# Patient Record
Sex: Male | Born: 1985 | Race: White | Hispanic: No | Marital: Married | Smoking: Never smoker
Health system: Southern US, Community
[De-identification: ages and names within clinical notes are randomized; demographics above are authoritative.]

## PROBLEM LIST (undated history)

## (undated) DIAGNOSIS — K219 Gastro-esophageal reflux disease without esophagitis: Secondary | ICD-10-CM

## (undated) HISTORY — PX: TONSILLECTOMY: SUR1361

## (undated) HISTORY — PX: EXTERNAL EAR SURGERY: SHX627

---
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
## 2006-11-08 ENCOUNTER — Emergency Department (HOSPITAL_COMMUNITY): Admission: EM | Admit: 2006-11-08 | Discharge: 2006-11-08 | Payer: Self-pay | Admitting: Emergency Medicine

## 2010-09-23 ENCOUNTER — Emergency Department (HOSPITAL_COMMUNITY)
Admission: EM | Admit: 2010-09-23 | Discharge: 2010-09-23 | Disposition: A | Discharge status: Home / Self Care | Payer: No Typology Code available for payment source | Attending: Emergency Medicine | Admitting: Emergency Medicine

## 2010-09-23 ENCOUNTER — Emergency Department (HOSPITAL_COMMUNITY): Payer: Self-pay

## 2010-09-23 DIAGNOSIS — S139XXA Sprain of joints and ligaments of unspecified parts of neck, initial encounter: Secondary | ICD-10-CM | POA: Insufficient documentation

## 2010-12-29 LAB — GC/CHLAMYDIA PROBE AMP, GENITAL: Chlamydia, DNA Probe: NEGATIVE

## 2010-12-29 LAB — URINALYSIS, ROUTINE W REFLEX MICROSCOPIC
Bilirubin Urine: NEGATIVE
Hgb urine dipstick: NEGATIVE
Ketones, ur: NEGATIVE
Nitrite: NEGATIVE
Protein, ur: NEGATIVE
Urobilinogen, UA: 1

## 2012-11-07 ENCOUNTER — Emergency Department (HOSPITAL_COMMUNITY)
Admission: EM | Admit: 2012-11-07 | Discharge: 2012-11-07 | Disposition: A | Discharge status: Home / Self Care | Payer: Self-pay | Attending: Emergency Medicine | Admitting: Emergency Medicine

## 2012-11-07 ENCOUNTER — Encounter (HOSPITAL_COMMUNITY): Payer: Self-pay | Admitting: *Deleted

## 2012-11-07 DIAGNOSIS — Z880 Allergy status to penicillin: Secondary | ICD-10-CM | POA: Insufficient documentation

## 2012-11-07 DIAGNOSIS — R109 Unspecified abdominal pain: Secondary | ICD-10-CM | POA: Insufficient documentation

## 2012-11-07 DIAGNOSIS — Z8719 Personal history of other diseases of the digestive system: Secondary | ICD-10-CM | POA: Insufficient documentation

## 2012-11-07 HISTORY — DX: Gastro-esophageal reflux disease without esophagitis: K21.9

## 2012-11-07 MED ORDER — DICYCLOMINE HCL 20 MG PO TABS: 20.0000 mg | ORAL_TABLET | Freq: Two times a day (BID) | ORAL | Status: DC

## 2012-11-07 NOTE — ED Provider Notes (Signed)
  CSN: [id number]     Arrival date & time 11/07/12  1225 History     First MD Initiated Contact with Patient 11/07/12 1241     Chief Complaint  Patient presents with  . Diarrhea   (Consider location/radiation/quality/duration/timing/severity/associated sxs/prior Treatment) Patient is a 27 y.o. male presenting with diarrhea. The history is provided by the patient and a parent.  Diarrhea  patient here complaining of abdominal cramping and loose stools times several months. No fever or chills. No vomiting. No urinary symptoms. For the last 24 hours, patient has noted sharp right-sided abdominal pain that is better after he moves his bowels. Denies any current diarrhea. Denies any weakness with standing. Denies any dizziness. No blood in his stool. Also notes increased flatus. Patient is concerned that he might have IBS because there is a strong family history of this. He does have a history of GERD and takes a PPI for this.  Past Medical History  Diagnosis Date  . GERD (gastroesophageal reflux disease)    Past Surgical History  Procedure Laterality Date  . External ear surgery     History reviewed. No pertinent family history. History  Substance Use Topics  . Smoking status: Never Smoker   . Smokeless tobacco: Not on file  . Alcohol Use: No    Review of Systems  Gastrointestinal: Positive for diarrhea.  All other systems reviewed and are negative.    Allergies  Penicillins  Home Medications  No current outpatient prescriptions on file. BP 138/89  Pulse 97  Temp(Src) 97.8 F (36.6 C) (Oral)  Resp 18  SpO2 98% Physical Exam  Nursing note and vitals reviewed. Constitutional: He is oriented to person, place, and time. He appears well-developed and well-nourished.  Non-toxic appearance. No distress.  HENT:  Head: Normocephalic and atraumatic.  Eyes: Conjunctivae, EOM and lids are normal. Pupils are equal, round, and reactive to light.  Neck: Normal range of motion. Neck  supple. No tracheal deviation present. No mass present.  Cardiovascular: Normal rate, regular rhythm and normal heart sounds.  Exam reveals no gallop.   No murmur heard. Pulmonary/Chest: Effort normal and breath sounds normal. No stridor. No respiratory distress. He has no decreased breath sounds. He has no wheezes. He has no rhonchi. He has no rales.  Abdominal: Soft. Normal appearance and bowel sounds are normal. He exhibits no distension. There is no tenderness. There is no rigidity, no rebound, no guarding and no CVA tenderness.  Musculoskeletal: Normal range of motion. He exhibits no edema and no tenderness.  Neurological: He is alert and oriented to person, place, and time. He has normal strength. No cranial nerve deficit or sensory deficit. GCS eye subscore is 4. GCS verbal subscore is 5. GCS motor subscore is 6.  Skin: Skin is warm and dry. No abrasion and no rash noted.  Psychiatric: He has a normal mood and affect. His speech is normal and behavior is normal.    ED Course   Procedures (including critical care time)  Labs Reviewed - No data to display No results found. No diagnosis found.  MDM  Patient with nonsurgical abdomen at this time. Suspect that the patient might indeed have IBS versus other abdominal pathology. I will prescribe the patient and to and give referral to GI on call. They do not believe that there is an emergency condition at this time. Family is agreeable to this. return process couldn't  Toy Baker, MD 11/07/12 1259

## 2012-11-07 NOTE — ED Notes (Addendum)
Pt reports abdominal cramping and diarrhea x several months.  Reports that last night he had sharp pain in his (R) side-states that the pain subsided when he had a BM.  Pt has a GI doctor.

## 2012-11-11 ENCOUNTER — Emergency Department (HOSPITAL_COMMUNITY): Payer: Medicaid Other

## 2012-11-11 ENCOUNTER — Encounter (HOSPITAL_COMMUNITY): Payer: Self-pay | Admitting: Cardiology

## 2012-11-11 ENCOUNTER — Observation Stay (HOSPITAL_COMMUNITY)
Admission: EM | Admit: 2012-11-11 | Discharge: 2012-11-12 | Disposition: A | Discharge status: Home / Self Care | Payer: Medicaid Other | Attending: General Surgery | Admitting: General Surgery

## 2012-11-11 DIAGNOSIS — S0181XA Laceration without foreign body of other part of head, initial encounter: Secondary | ICD-10-CM

## 2012-11-11 DIAGNOSIS — K219 Gastro-esophageal reflux disease without esophagitis: Secondary | ICD-10-CM | POA: Insufficient documentation

## 2012-11-11 DIAGNOSIS — S060X9A Concussion with loss of consciousness of unspecified duration, initial encounter: Secondary | ICD-10-CM

## 2012-11-11 DIAGNOSIS — Y9269 Other specified industrial and construction area as the place of occurrence of the external cause: Secondary | ICD-10-CM | POA: Insufficient documentation

## 2012-11-11 DIAGNOSIS — S060XAA Concussion with loss of consciousness status unknown, initial encounter: Secondary | ICD-10-CM

## 2012-11-11 DIAGNOSIS — S0100XA Unspecified open wound of scalp, initial encounter: Secondary | ICD-10-CM

## 2012-11-11 DIAGNOSIS — S0180XA Unspecified open wound of other part of head, initial encounter: Secondary | ICD-10-CM | POA: Insufficient documentation

## 2012-11-11 DIAGNOSIS — S0101XA Laceration without foreign body of scalp, initial encounter: Secondary | ICD-10-CM

## 2012-11-11 DIAGNOSIS — S0990XA Unspecified injury of head, initial encounter: Secondary | ICD-10-CM

## 2012-11-11 DIAGNOSIS — S63509A Unspecified sprain of unspecified wrist, initial encounter: Secondary | ICD-10-CM | POA: Insufficient documentation

## 2012-11-11 DIAGNOSIS — S098XXA Other specified injuries of head, initial encounter: Secondary | ICD-10-CM

## 2012-11-11 DIAGNOSIS — W208XXA Other cause of strike by thrown, projected or falling object, initial encounter: Secondary | ICD-10-CM | POA: Insufficient documentation

## 2012-11-11 DIAGNOSIS — S060X1A Concussion with loss of consciousness of 30 minutes or less, initial encounter: Principal | ICD-10-CM | POA: Insufficient documentation

## 2012-11-11 DIAGNOSIS — Y990 Civilian activity done for income or pay: Secondary | ICD-10-CM | POA: Insufficient documentation

## 2012-11-11 LAB — BASIC METABOLIC PANEL
BUN: 8 mg/dL (ref 6–23)
CO2: 25 mEq/L (ref 19–32)
Calcium: 9.2 mg/dL (ref 8.4–10.5)
Chloride: 104 mEq/L (ref 96–112)
Creatinine, Ser: 1.08 mg/dL (ref 0.50–1.35)
GFR calc Af Amer: >90 mL/min (ref >90)
GFR calc non Af Amer: >90 mL/min (ref >90)
Glucose, Bld: 130 mg/dL — ABNORMAL HIGH (ref 70–99)
Potassium: 2.8 mEq/L — ABNORMAL LOW (ref 3.5–5.1)
Sodium: 140 mEq/L (ref 135–145)

## 2012-11-11 LAB — CBC WITH DIFFERENTIAL/PLATELET
Basophils Absolute: 0 10*3/uL (ref 0.0–0.1)
Basophils Relative: 0 % (ref 0–1)
Eosinophils Absolute: 0.1 10*3/uL (ref 0.0–0.7)
Eosinophils Relative: 1 % (ref 0–5)
HCT: 42.7 % (ref 39.0–52.0)
Hemoglobin: 15.2 g/dL (ref 13.0–17.0)
Lymphocytes Relative: 33 % (ref 12–46)
Lymphs Abs: 2.4 10*3/uL (ref 0.7–4.0)
MCH: 29.2 pg (ref 26.0–34.0)
MCHC: 35.6 g/dL (ref 30.0–36.0)
MCV: 82.1 fL (ref 78.0–100.0)
Monocytes Absolute: 0.4 10*3/uL (ref 0.1–1.0)
Monocytes Relative: 5 % (ref 3–12)
Neutro Abs: 4.3 10*3/uL (ref 1.7–7.7)
Neutrophils Relative %: 60 % (ref 43–77)
Platelets: 280 10*3/uL (ref 150–400)
RBC: 5.2 MIL/uL (ref 4.22–5.81)
RDW: 13.2 % (ref 11.5–15.5)
WBC: 7.2 10*3/uL (ref 4.0–10.5)

## 2012-11-11 LAB — TYPE AND SCREEN
ABO/RH(D): A POS
Antibody Screen: NEGATIVE

## 2012-11-11 LAB — ABO/RH: ABO/RH(D): A POS

## 2012-11-11 MED ORDER — BACITRACIN ZINC 500 UNIT/GM EX OINT
TOPICAL_OINTMENT | Freq: Two times a day (BID) | CUTANEOUS | Status: DC
  Administered 2012-11-11 – 2012-11-12 (×2): 31.5556 via TOPICAL
  Filled 2012-11-11 (×2): qty 28.35

## 2012-11-11 MED ORDER — HYDROCODONE-ACETAMINOPHEN 5-325 MG PO TABS: 2.0000 | ORAL_TABLET | ORAL | Status: DC | PRN

## 2012-11-11 MED ORDER — ONDANSETRON HCL 4 MG PO TABS: 4.0000 mg | ORAL_TABLET | Freq: Four times a day (QID) | ORAL | Status: DC | PRN

## 2012-11-11 MED ORDER — MORPHINE SULFATE 4 MG/ML IJ SOLN
4.0000 mg | Freq: Once | INTRAMUSCULAR | Status: AC
  Administered 2012-11-11: 4 mg via INTRAVENOUS
  Filled 2012-11-11: qty 1

## 2012-11-11 MED ORDER — HYDROCODONE-ACETAMINOPHEN 5-325 MG PO TABS: 1.0000 | ORAL_TABLET | ORAL | Status: DC | PRN

## 2012-11-11 MED ORDER — POTASSIUM CHLORIDE CRYS ER 20 MEQ PO TBCR
60.0000 meq | EXTENDED_RELEASE_TABLET | Freq: Once | ORAL | Status: AC
  Administered 2012-11-11: 60 meq via ORAL
  Filled 2012-11-11: qty 3

## 2012-11-11 MED ORDER — ONDANSETRON HCL 4 MG/2ML IJ SOLN
4.0000 mg | Freq: Once | INTRAMUSCULAR | Status: AC
  Administered 2012-11-11: 4 mg via INTRAVENOUS
  Filled 2012-11-11: qty 2

## 2012-11-11 MED ORDER — ENOXAPARIN SODIUM 40 MG/0.4ML ~~LOC~~ SOLN
40.0000 mg | SUBCUTANEOUS | Status: DC
  Filled 2012-11-11 (×3): qty 0.4

## 2012-11-11 MED ORDER — DICYCLOMINE HCL 20 MG PO TABS
20.0000 mg | ORAL_TABLET | Freq: Two times a day (BID) | ORAL | Status: DC
  Administered 2012-11-11 – 2012-11-12 (×2): 20 mg via ORAL
  Filled 2012-11-11 (×4): qty 1

## 2012-11-11 MED ORDER — SODIUM CHLORIDE 0.9 % IV BOLUS (SEPSIS)
1000.0000 mL | Freq: Once | INTRAVENOUS | Status: AC
  Administered 2012-11-11: 1000 mL via INTRAVENOUS

## 2012-11-11 MED ORDER — TETANUS-DIPHTH-ACELL PERTUSSIS 5-2.5-18.5 LF-MCG/0.5 IM SUSP
0.5000 mL | Freq: Once | INTRAMUSCULAR | Status: AC
  Administered 2012-11-11: 0.5 mL via INTRAMUSCULAR
  Filled 2012-11-11: qty 0.5

## 2012-11-11 MED ORDER — PANTOPRAZOLE SODIUM 40 MG PO TBEC
80.0000 mg | DELAYED_RELEASE_TABLET | Freq: Every day | ORAL | Status: DC
  Administered 2012-11-12: 80 mg via ORAL
  Filled 2012-11-11: qty 2

## 2012-11-11 MED ORDER — KETOROLAC TROMETHAMINE 15 MG/ML IJ SOLN
15.0000 mg | Freq: Once | INTRAMUSCULAR | Status: AC
  Administered 2012-11-11: 15 mg via INTRAVENOUS
  Filled 2012-11-11: qty 1

## 2012-11-11 MED ORDER — ONDANSETRON HCL 4 MG/2ML IJ SOLN: 4.0000 mg | Freq: Four times a day (QID) | INTRAMUSCULAR | Status: DC | PRN

## 2012-11-11 MED ORDER — SODIUM CHLORIDE 0.45 % IV SOLN
INTRAVENOUS | Status: DC
  Administered 2012-11-11: 22:00:00 via INTRAVENOUS
  Filled 2012-11-11 (×3): qty 1000

## 2012-11-11 MED ORDER — ACETAMINOPHEN 325 MG PO TABS
650.0000 mg | ORAL_TABLET | ORAL | Status: DC | PRN
  Administered 2012-11-11: 650 mg via ORAL
  Filled 2012-11-11: qty 2

## 2012-11-11 NOTE — ED Notes (Signed)
Pt given warm blankets.

## 2012-11-11 NOTE — ED Notes (Signed)
Patient transported to CT 

## 2012-11-11 NOTE — Progress Notes (Signed)
Responded to level 2 trauma to provide emotional and spiritual support to pt. Family. Pt. Was at work changing tire when tire exploded causing head injury. Mother and Father at bedside. Pt. Preparing to go CT for scan.  Provided emotional support, words of encouragement and presence to parents and pt.Facilitated information sharing between staff and family. Will as needed.  11/11/12 1100  Clinical Encounter Type  Visited With Patient and family together;Health care provider  Visit Type Spiritual support;ED;Trauma  Referral From Nurse  Spiritual Encounters  Spiritual Needs Emotional  Stress Factors  Patient Stress Factors None identified  Family Stress [address]  [place], [phone]

## 2012-11-11 NOTE — ED Provider Notes (Signed)
  Physical Exam  BP 99/51  Pulse 75  Temp(Src) 98 F (36.7 C) (Oral)  Resp 16  Ht 5\' 5"  (1.651 m)  Wt 180 lb (81.647 kg)  BMI 29.95 kg/m2  SpO2 98%  Physical Exam  ED Course  Procedures  Pt s/p blunt trauma. Concussed. Trauma to see and evaluate for possible obs admission.   Derwood Kaplan, MD 11/11/12 [phone]

## 2012-11-11 NOTE — ED Notes (Signed)
Trauma service at the bedside.  

## 2012-11-11 NOTE — H&P (Signed)
Significant postconcussive symptoms. Patient examined and I agree with the assessment and plan We'll also check lumbar spine films to rule out fracture.  I spoke with the patient and his family and discussed the plan of care. Violeta Gelinas, MD, MPH, FACS Pager: [phone]  11/11/2012 5:44 PM

## 2012-11-11 NOTE — ED Notes (Signed)
Family at bedside. 

## 2012-11-11 NOTE — ED Provider Notes (Signed)
CSN: [id number]     Arrival date & time 11/11/12  1110 History   First MD Initiated Contact with Patient 11/11/12 1116     No chief complaint on file.  (Consider location/radiation/quality/duration/timing/severity/associated sxs/prior Treatment) HPI  27 year old male presenting as a level II trauma.Incident happened just before arrival. Patient was working on a tire which was off the vehicle. The tire exploded and the tire rim was launched into the air. Apparently struck the ground with enough force to leave a mark in the concrete before flying up into the air. Patient was struck in his head. No loss of consciousness. Some repetitive questioning and  confusion. Patient is complaining of a headache, otherwise denies any pain anywhere. No visual complaints. Mild nausea, but no vomiting. No numbness, tingling or loss of strength. No significant past medical history aside from reflux. He takes OTC medication for this. No blood thinners. Allergy to penicillin. Cannot remember when he last ate.  Past Medical History  Diagnosis Date  . GERD (gastroesophageal reflux disease)    Past Surgical History  Procedure Laterality Date  . External ear surgery     No family history on file. History  Substance Use Topics  . Smoking status: Never Smoker   . Smokeless tobacco: Not on file  . Alcohol Use: No    Review of Systems  All systems reviewed and negative, other than as noted in HPI.   Allergies  Penicillins  Home Medications   Current Outpatient Rx  Name  Route  Sig  Dispense  Refill  . dicyclomine (BENTYL) 20 MG tablet   Oral   Take 1 tablet (20 mg total) by mouth 2 (two) times daily.   20 tablet   0    SpO2 100% Physical Exam  Nursing note and vitals reviewed. Constitutional: He appears well-developed and well-nourished. No distress.  HENT:  4 cm laceration to the high left parietal region. Macerated wound margins. Mild oozing. Underlying hematoma. No pulsatile bleeding. No  defect palpated. In c-collar.   Eyes: Conjunctivae and EOM are normal. Pupils are equal, round, and reactive to light. Right eye exhibits no discharge. Left eye exhibits no discharge.  Neck: Neck supple.  Cardiovascular: Normal rate, regular rhythm and normal heart sounds.  Exam reveals no gallop and no friction rub.   No murmur heard. Pulmonary/Chest: Effort normal and breath sounds normal. No respiratory distress.  Abdominal: Soft. He exhibits no distension. There is no tenderness.  Musculoskeletal: He exhibits no edema and no tenderness.  No midline spinal tenderness  Neurological: He is alert. No cranial nerve deficit. He exhibits normal muscle tone. Coordination normal.  GCS 14. Mild confusion and some repetitive questioning.  Skin: Skin is warm and dry.  Psychiatric: He has a normal mood and affect. His behavior is normal. Thought content normal.    ED Course  Procedures (including critical care time)  LACERATION REPAIR Performed by: Raeford Razor Authorized by: Raeford Razor Consent: Verbal consent obtained. Risks and benefits: risks, benefits and alternatives were discussed Consent given by: patient Patient identity confirmed: provided demographic data Prepped and Draped in normal sterile fashion Wound explored  Laceration Location: forehead Laceration Length: 3 cm  No Foreign Bodies seen or palpated  Anesthesia: local infiltration  Local anesthetic: lidocaine 1 % w epinephrine  Anesthetic total: 2 ml  Irrigation method: syringe Amount of cleaning: standard  Skin closure: single layer  Number of sutures: 1  Technique: running  Patient tolerance: Patient tolerated the procedure well with no immediate  complications.  LACERATION REPAIR Performed by: Raeford Razor Authorized by: Raeford Razor Consent: Verbal consent obtained. Risks and benefits: risks, benefits and alternatives were discussed Consent given by: patient Patient identity confirmed:  provided demographic data Prepped and Draped in normal sterile fashion Wound explored  Laceration Location: scalp  Laceration Length: 4 cm  No Foreign Bodies seen or palpated  Anesthesia: local infiltration  Local anesthetic: none  Anesthetic total: n/a  Amount of cleaning: standard  Skin closure: single layer  Number of sutures: 7  Technique: stapled  Patient tolerance: Patient tolerated the procedure well with no immediate complications.   Labs Review Labs Reviewed  BASIC METABOLIC PANEL - Abnormal; Notable for the following:    Potassium 2.8 (*)    Glucose, Bld 130 (*)    All other components within normal limits  CBC WITH DIFFERENTIAL  TYPE AND SCREEN  ABO/RH   Imaging Review Ct Head Wo Contrast  11/11/2012   *RADIOLOGY REPORT*  Clinical Data:  Head injury, laceration to high left parietal region  CT HEAD WITHOUT CONTRAST CT CERVICAL SPINE WITHOUT CONTRAST  Technique:  Multidetector CT imaging of the head and cervical spine was performed following the standard protocol without intravenous contrast.  Multiplanar CT image reconstructions of the cervical spine were also generated.  Comparison:  None.  CT HEAD  Findings: No evidence of parenchymal hemorrhage or extra-axial fluid collection. No mass lesion, mass effect, or midline shift.  No CT evidence of acute infarction.  Cerebral volume is age appropriate.  No ventriculomegaly.  The visualized paranasal sinuses are essentially clear. The mastoid air cells are unopacified.  Soft tissue injury/laceration overlying the left vertex. Additional soft tissue injury/laceration overlying the left frontal bone.  No evidence of calvarial fracture.  IMPRESSION: Soft tissue injury/laceration overlying the left vertex and left frontal bone.  No evidence of calvarial fracture.  No evidence of acute intracranial abnormality.  CT CERVICAL SPINE  Findings: Mild straightening of the cervical spine, likely positional.  No evidence of fracture  or dislocation.  Vertebral body heights and intervertebral disc spaces are maintained.  Dens appears intact.  No prevertebral soft tissue swelling.  Visualized thyroid is unremarkable.  Visualized lung apices are clear.  IMPRESSION:  Normal cervical spine CT.   Original Report Authenticated By: Charline Bills, M.D.   Ct Cervical Spine Wo Contrast  11/11/2012   *RADIOLOGY REPORT*  Clinical Data:  Head injury, laceration to high left parietal region  CT HEAD WITHOUT CONTRAST CT CERVICAL SPINE WITHOUT CONTRAST  Technique:  Multidetector CT imaging of the head and cervical spine was performed following the standard protocol without intravenous contrast.  Multiplanar CT image reconstructions of the cervical spine were also generated.  Comparison:  None.  CT HEAD  Findings: No evidence of parenchymal hemorrhage or extra-axial fluid collection. No mass lesion, mass effect, or midline shift.  No CT evidence of acute infarction.  Cerebral volume is age appropriate.  No ventriculomegaly.  The visualized paranasal sinuses are essentially clear. The mastoid air cells are unopacified.  Soft tissue injury/laceration overlying the left vertex. Additional soft tissue injury/laceration overlying the left frontal bone.  No evidence of calvarial fracture.  IMPRESSION: Soft tissue injury/laceration overlying the left vertex and left frontal bone.  No evidence of calvarial fracture.  No evidence of acute intracranial abnormality.  CT CERVICAL SPINE  Findings: Mild straightening of the cervical spine, likely positional.  No evidence of fracture or dislocation.  Vertebral body heights and intervertebral disc spaces are maintained.  Dens  appears intact.  No prevertebral soft tissue swelling.  Visualized thyroid is unremarkable.  Visualized lung apices are clear.  IMPRESSION:  Normal cervical spine CT.   Original Report Authenticated By: Charline Bills, M.D.   Dg Hand Complete Right  11/11/2012   *RADIOLOGY REPORT*  Clinical Data:   right hand pain post trauma  RIGHT HAND - COMPLETE 3+ VIEW  Comparison: None.  Findings: Three views of the right hand submitted.  No acute fracture or subluxation.  No radiopaque foreign body.  IMPRESSION: No acute fracture or subluxation.   Original Report Authenticated By: Natasha Mead, M.D.    MDM   1. Head injury, initial encounter   2. Scalp laceration, initial encounter   3. Forehead laceration, initial encounter    27 year old male with a head injury. Cnfusion, otherwise neurological examination is nonfocal. No blood thinners. Otherwise healthy. Hemodynamically stable. Plan CT of the head. We will image the cervical spine as well as cannot clinically clear with patient being confused. Update tetanus. Further wound care/closure pending CT results.  On repeat exam, pt additionally with laceration to forehead requiring repair. Some mild swelling and tenderness in area of 2nd metacarpal R hand. WIll XR.   Attempted to ambulate. Vomiting. Continues with repetitive questioning. Will discuss with trauma for possible admit for observation.   Raeford Razor, MD 11/11/12 [phone]

## 2012-11-11 NOTE — ED Notes (Signed)
MD at bedside with suture cart. 

## 2012-11-11 NOTE — H&P (Signed)
Manuel Glover is an 27 y.o. male.   Chief Complaint: Concussion HPI: Manuel Glover was bending down to pick up a tire when it exploded. The rim hit him in the face, hit the ceiling, then dropped down onto his head. He fell to the ground and there was a brief loss of consciousness. He came in as a level 2 trauma. His workup was negative but he was quite post-concussive and we were asked to admit.  Past Medical History  Diagnosis Date  . GERD (gastroesophageal reflux disease)     Past Surgical History  Procedure Laterality Date  . External ear surgery      History reviewed. No pertinent family history. Social History:  reports that he has never smoked. He does not have any smokeless tobacco history on file. He reports that he does not drink alcohol or use illicit drugs.  Allergies:  Allergies  Allergen Reactions  . Penicillins     hives   Results for orders placed during the hospital encounter of 11/11/12 (from the past 48 hour(s))  TYPE AND SCREEN     Status: None   Collection Time    11/11/12 11:20 AM      Result Value Range   ABO/RH(D) A POS     Antibody Screen NEG     Sample Expiration 11/14/2012    ABO/RH     Status: None   Collection Time    11/11/12 11:20 AM      Result Value Range   ABO/RH(D) A POS    BASIC METABOLIC PANEL     Status: Abnormal   Collection Time    11/11/12 11:23 AM      Result Value Range   Sodium 140  135 - 145 mEq/L   Potassium 2.8 (*) 3.5 - 5.1 mEq/L   Chloride 104  96 - 112 mEq/L   CO2 25  19 - 32 mEq/L   Glucose, Bld 130 (*) 70 - 99 mg/dL   BUN 8  6 - 23 mg/dL   Creatinine, Ser 1.61  0.50 - 1.35 mg/dL   Calcium 9.2  8.4 - 09.6 mg/dL   GFR calc non Af Amer >90  >90 mL/min   GFR calc Af Amer >90  >90 mL/min   Comment: (NOTE)     The eGFR has been calculated using the CKD EPI equation.     This calculation has not been validated in all clinical situations.     eGFR's persistently <90 mL/min signify possible Chronic Kidney     Disease.  CBC WITH  DIFFERENTIAL     Status: None   Collection Time    11/11/12 11:23 AM      Result Value Range   WBC 7.2  4.0 - 10.5 K/uL   RBC 5.20  4.22 - 5.81 MIL/uL   Hemoglobin 15.2  13.0 - 17.0 g/dL   HCT 04.5  40.9 - 81.1 %   MCV 82.1  78.0 - 100.0 fL   MCH 29.2  26.0 - 34.0 pg   MCHC 35.6  30.0 - 36.0 g/dL   RDW 91.4  78.2 - 95.6 %   Platelets 280  150 - 400 K/uL   Neutrophils Relative % 60  43 - 77 %   Neutro Abs 4.3  1.7 - 7.7 K/uL   Lymphocytes Relative 33  12 - 46 %   Lymphs Abs 2.4  0.7 - 4.0 K/uL   Monocytes Relative 5  3 - 12 %   Monocytes Absolute  0.4  0.1 - 1.0 K/uL   Eosinophils Relative 1  0 - 5 %   Eosinophils Absolute 0.1  0.0 - 0.7 K/uL   Basophils Relative 0  0 - 1 %   Basophils Absolute 0.0  0.0 - 0.1 K/uL   Ct Head Wo Contrast  11/11/2012   *RADIOLOGY REPORT*  Clinical Data:  Head injury, laceration to high left parietal region  CT HEAD WITHOUT CONTRAST CT CERVICAL SPINE WITHOUT CONTRAST  Technique:  Multidetector CT imaging of the head and cervical spine was performed following the standard protocol without intravenous contrast.  Multiplanar CT image reconstructions of the cervical spine were also generated.  Comparison:  None.  CT HEAD  Findings: No evidence of parenchymal hemorrhage or extra-axial fluid collection. No mass lesion, mass effect, or midline shift.  No CT evidence of acute infarction.  Cerebral volume is age appropriate.  No ventriculomegaly.  The visualized paranasal sinuses are essentially clear. The mastoid air cells are unopacified.  Soft tissue injury/laceration overlying the left vertex. Additional soft tissue injury/laceration overlying the left frontal bone.  No evidence of calvarial fracture.  IMPRESSION: Soft tissue injury/laceration overlying the left vertex and left frontal bone.  No evidence of calvarial fracture.  No evidence of acute intracranial abnormality.  CT CERVICAL SPINE  Findings: Mild straightening of the cervical spine, likely positional.  No  evidence of fracture or dislocation.  Vertebral body heights and intervertebral disc spaces are maintained.  Dens appears intact.  No prevertebral soft tissue swelling.  Visualized thyroid is unremarkable.  Visualized lung apices are clear.  IMPRESSION:  Normal cervical spine CT.   Original Report Authenticated By: Charline Bills, M.D.   Dg Hand Complete Right  11/11/2012   *RADIOLOGY REPORT*  Clinical Data:  right hand pain post trauma  RIGHT HAND - COMPLETE 3+ VIEW  Comparison: None.  Findings: Three views of the right hand submitted.  No acute fracture or subluxation.  No radiopaque foreign body.  IMPRESSION: No acute fracture or subluxation.   Original Report Authenticated By: Natasha Mead, M.D.    Review of Systems  Constitutional: Negative for weight loss.  HENT: Negative for hearing loss, ear pain, neck pain, tinnitus and ear discharge.   Eyes: Negative for blurred vision, double vision, photophobia and pain.  Respiratory: Negative for cough, sputum production and shortness of breath.   Cardiovascular: Negative for chest pain.  Gastrointestinal: Positive for nausea and vomiting. Negative for abdominal pain.  Genitourinary: Negative for dysuria, urgency, frequency and flank pain.  Musculoskeletal: Negative for myalgias, back pain, joint pain and falls.  Neurological: Positive for loss of consciousness. Negative for dizziness, tingling, sensory change, focal weakness and headaches.  Endo/Heme/Allergies: Does not bruise/bleed easily.  Psychiatric/Behavioral: Positive for memory loss. Negative for depression and substance abuse. The patient is not nervous/anxious.     Blood pressure 99/51, pulse 75, temperature 98 F (36.7 C), temperature source Oral, resp. rate 16, height 5\' 5"  (1.651 m), weight 180 lb (81.647 kg), SpO2 98.00%. Physical Exam  Vitals reviewed. Constitutional: He is oriented to person, place, and time. He appears well-developed and well-nourished. He is cooperative. No  distress. Cervical collar and nasal cannula in place.  HENT:  Head: Normocephalic. Head is with laceration. Head is without raccoon's eyes, without Battle's sign, without abrasion and without contusion.    Right Ear: Hearing and external ear normal. No lacerations. No drainage or tenderness. No foreign bodies.  Left Ear: Hearing and external ear normal. No lacerations. No drainage or  tenderness. No foreign bodies.  Ears:  Nose: Nose normal. No nose lacerations, sinus tenderness, nasal deformity or nasal septal hematoma. No epistaxis.  Mouth/Throat: Uvula is midline, oropharynx is clear and moist and mucous membranes are normal. No lacerations. No oropharyngeal exudate.  Eyes: Conjunctivae, EOM and lids are normal. Pupils are equal, round, and reactive to light. Right eye exhibits no discharge. Left eye exhibits no discharge. No scleral icterus.  Neck: Trachea normal and normal range of motion. Neck supple. No JVD present. No spinous process tenderness and no muscular tenderness present. Carotid bruit is not present. No tracheal deviation present. No thyromegaly present.  Cardiovascular: Normal rate, regular rhythm, normal heart sounds, intact distal pulses and normal pulses.  Exam reveals no gallop and no friction rub.   No murmur heard. Respiratory: Effort normal and breath sounds normal. No stridor. No respiratory distress. He has no wheezes. He has no rales. He exhibits no tenderness, no bony tenderness, no laceration and no crepitus.  GI: Soft. Normal appearance and bowel sounds are normal. He exhibits no distension. There is no tenderness. There is no rigidity, no rebound, no guarding and no CVA tenderness.  Musculoskeletal: Normal range of motion. He exhibits no edema and no tenderness.       Lumbar back: He exhibits bony tenderness (~L2).  Lymphadenopathy:    He has no cervical adenopathy.  Neurological: He is alert and oriented to person, place, and time. He has normal strength. No  cranial nerve deficit or sensory deficit. GCS eye subscore is 4. GCS verbal subscore is 5. GCS motor subscore is 6.  Skin: Skin is warm, dry and intact. He is not diaphoretic.  Psychiatric: He has a normal mood and affect. His speech is normal and behavior is normal. He exhibits abnormal recent memory.     Assessment/Plan Blunt head trauma Concussion -- Supportive care, ST consult [place] -- Local care L-spine TTP -- Will get x-rays GERD -- Protonix    Freeman Caldron, PA-C Pager: [phone] General Trauma PA Pager: [phone]  11/11/2012, 3:55 PM

## 2012-11-11 NOTE — ED Notes (Signed)
Pt to department via EMS- pt reports that he was at work. States that he was filling a tire and it exploded. The tire rim hit him in the top of the head. Pt with repetitive questioning, but alert to self and surroundings. No distress noted. Bleeding controlled. Bp-140/100 Hr-100 20g left hand

## 2012-11-12 MED ORDER — BACITRACIN ZINC 500 UNIT/GM EX OINT: TOPICAL_OINTMENT | Freq: Two times a day (BID) | CUTANEOUS | Status: DC

## 2012-11-12 MED ORDER — ACETAMINOPHEN 325 MG PO TABS: 650.0000 mg | ORAL_TABLET | ORAL | Status: DC | PRN
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
## 2012-11-12 NOTE — Progress Notes (Signed)
Improved. Will see how he does with therapies. Possible D/C today. Patient examined and I agree with the assessment and plan  Violeta Gelinas, MD, MPH, FACS Pager: [phone]  11/12/2012 9:17 AM

## 2012-11-12 NOTE — Progress Notes (Signed)
Patient ID: Manuel Glover, male   DOB: 1986-03-12, 27 y.o.   MRN: [id number]  LOS: 1 day   Subjective: Pt feeling much better today, no headache.  No nausea or vomiting, appetite is poor.  He has been ambulating.  Voiding without problems.  Denies weakness.  Complaining of right wrist pain.  Objective: Vital signs in last 24 hours: Temp:  [98 F (36.7 C)-99.8 F (37.7 C)] 98.4 F (36.9 C) (08/27 0555) Pulse Rate:  [56-95] 56 (08/27 0555) Resp:  [11-18] 16 (08/27 0555) BP: (99-139)/(51-94) 101/55 mmHg (08/27 0555) SpO2:  [98 %-100 %] 100 % (08/27 0555) Weight:  [180 lb (81.647 kg)-185 lb 3 oz (84 kg)] 185 lb 3 oz (84 kg) (08/26 2147) Last BM Date: 11/10/12  Lab Results:  CBC  Recent Labs  11/11/12 1123  WBC 7.2  HGB 15.2  HCT 42.7  PLT 280   BMET  Recent Labs  11/11/12 1123  NA 140  K 2.8*  CL 104  CO2 25  GLUCOSE 130*  BUN 8  CREATININE 1.08  CALCIUM 9.2    Imaging: Dg Lumbar Spine Complete  11/11/2012   *RADIOLOGY REPORT*  Clinical Data: Pain after fall  LUMBAR SPINE - COMPLETE 4+ VIEW  Comparison: CT urogram 03/29/2008  Findings: There are five lumbar-type vertebral bodies.  Vertebral bodies are normal in height and alignment.  The disc spaces are preserved.  No pars defect or acute fracture is identified. Sacroiliac joints appear within normal limits.  Visualized bowel gas pattern and visualized portion the bony pelvis appear within normal limits.  IMPRESSION: No acute bony abnormality or significant degenerative change.   Original Report Authenticated By: Britta Mccreedy, M.D.   Ct Head Wo Contrast  11/11/2012   *RADIOLOGY REPORT*  Clinical Data:  Head injury, laceration to high left parietal region  CT HEAD WITHOUT CONTRAST CT CERVICAL SPINE WITHOUT CONTRAST  Technique:  Multidetector CT imaging of the head and cervical spine was performed following the standard protocol without intravenous contrast.  Multiplanar CT image reconstructions of the cervical spine were  also generated.  Comparison:  None.  CT HEAD  Findings: No evidence of parenchymal hemorrhage or extra-axial fluid collection. No mass lesion, mass effect, or midline shift.  No CT evidence of acute infarction.  Cerebral volume is age appropriate.  No ventriculomegaly.  The visualized paranasal sinuses are essentially clear. The mastoid air cells are unopacified.  Soft tissue injury/laceration overlying the left vertex. Additional soft tissue injury/laceration overlying the left frontal bone.  No evidence of calvarial fracture.  IMPRESSION: Soft tissue injury/laceration overlying the left vertex and left frontal bone.  No evidence of calvarial fracture.  No evidence of acute intracranial abnormality.  CT CERVICAL SPINE  Findings: Mild straightening of the cervical spine, likely positional.  No evidence of fracture or dislocation.  Vertebral body heights and intervertebral disc spaces are maintained.  Dens appears intact.  No prevertebral soft tissue swelling.  Visualized thyroid is unremarkable.  Visualized lung apices are clear.  IMPRESSION:  Normal cervical spine CT.   Original Report Authenticated By: Charline Bills, M.D.   Ct Cervical Spine Wo Contrast  11/11/2012   *RADIOLOGY REPORT*  Clinical Data:  Head injury, laceration to high left parietal region  CT HEAD WITHOUT CONTRAST CT CERVICAL SPINE WITHOUT CONTRAST  Technique:  Multidetector CT imaging of the head and cervical spine was performed following the standard protocol without intravenous contrast.  Multiplanar CT image reconstructions of the cervical spine were also generated.  Comparison:  None.  CT HEAD  Findings: No evidence of parenchymal hemorrhage or extra-axial fluid collection. No mass lesion, mass effect, or midline shift.  No CT evidence of acute infarction.  Cerebral volume is age appropriate.  No ventriculomegaly.  The visualized paranasal sinuses are essentially clear. The mastoid air cells are unopacified.  Soft tissue  injury/laceration overlying the left vertex. Additional soft tissue injury/laceration overlying the left frontal bone.  No evidence of calvarial fracture.  IMPRESSION: Soft tissue injury/laceration overlying the left vertex and left frontal bone.  No evidence of calvarial fracture.  No evidence of acute intracranial abnormality.  CT CERVICAL SPINE  Findings: Mild straightening of the cervical spine, likely positional.  No evidence of fracture or dislocation.  Vertebral body heights and intervertebral disc spaces are maintained.  Dens appears intact.  No prevertebral soft tissue swelling.  Visualized thyroid is unremarkable.  Visualized lung apices are clear.  IMPRESSION:  Normal cervical spine CT.   Original Report Authenticated By: Charline Bills, M.D.   Dg Hand Complete Right  11/11/2012   *RADIOLOGY REPORT*  Clinical Data:  right hand pain post trauma  RIGHT HAND - COMPLETE 3+ VIEW  Comparison: None.  Findings: Three views of the right hand submitted.  No acute fracture or subluxation.  No radiopaque foreign body.  IMPRESSION: No acute fracture or subluxation.   Original Report Authenticated By: Natasha Mead, M.D.     PE: General appearance: alert, cooperative, appears stated age and no distress Eyes: conjunctivae/corneas clear. PERRL, EOM's intact. Fundi benign. Head- lacerations closed, forehead with sutures and scalp with staples, no bleeding Resp: clear to auscultation bilaterally Cardio: regular rate and rhythm, S1, S2 normal, no murmur, click, rub or gallop GI: soft, non-tender; bowel sounds normal; no masses,  no organomegaly Extremities: right wrist-normal ROM, no ecchymosis, swelling noted, no evident fracture.  push/plls are strong and equal.  Distal pulses are intact.  No edema.   Patient Active Problem List   Diagnosis Date Noted  . Blunt head trauma 11/11/2012  . Concussion 11/11/2012  . Scalp laceration 11/11/2012  . Facial laceration 11/11/2012  . GERD (gastroesophageal reflux  disease) 11/11/2012   Assessment/Plan  Blunt head trauma  Concussion -- improving, ST and PT [place] -- Local care.  Schedule follow up in clinic friday VTE - SCD's, Lovenox, mobilize FEN - tolerating diet Dispo -- home today following ST/PT evaluation   Ashok Norris, ANP-BC Pager: [phone] General Trauma [facility] Pager: [phone]   11/12/2012 9:12 AM
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
## 2012-11-12 NOTE — Progress Notes (Signed)
Orthopedic Tech Progress Note Patient Details:  ERLIN GARDELLA [birth year] [id number]  Ortho Devices Type of Ortho Device: Ace wrap Ortho Device/Splint Interventions: Application   Cammer, Mickie Bail 11/12/2012, 11:19 AM
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
## 2012-11-13 ENCOUNTER — Telehealth (HOSPITAL_COMMUNITY): Payer: Self-pay | Admitting: Emergency Medicine

## 2012-11-13 NOTE — Telephone Encounter (Signed)
Went over wound care instructions.

## 2012-11-14 ENCOUNTER — Encounter (INDEPENDENT_AMBULATORY_CARE_PROVIDER_SITE_OTHER): Payer: Self-pay

## 2012-11-14 ENCOUNTER — Ambulatory Visit (INDEPENDENT_AMBULATORY_CARE_PROVIDER_SITE_OTHER): Payer: Medicaid Other | Admitting: General Surgery

## 2012-11-14 VITALS — BP 122/80 | HR 76 | Resp 18 | Ht 65.0 in | Wt 187.0 lb

## 2012-11-14 DIAGNOSIS — IMO0002 Reserved for concepts with insufficient information to code with codable children: Secondary | ICD-10-CM

## 2012-11-14 DIAGNOSIS — S0101XS Laceration without foreign body of scalp, sequela: Secondary | ICD-10-CM

## 2012-11-14 DIAGNOSIS — F0781 Postconcussional syndrome: Secondary | ICD-10-CM
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
## 2012-11-14 NOTE — Patient Instructions (Signed)
You may not drive until your symptoms resolve  We will take the rest of your staples out next Friday.

## 2012-11-21 ENCOUNTER — Ambulatory Visit (INDEPENDENT_AMBULATORY_CARE_PROVIDER_SITE_OTHER): Payer: Self-pay | Admitting: Internal Medicine

## 2012-11-21 ENCOUNTER — Encounter (INDEPENDENT_AMBULATORY_CARE_PROVIDER_SITE_OTHER): Payer: Self-pay

## 2012-11-21 VITALS — BP 110/72 | HR 72 | Temp 97.6°F | Resp 14 | Ht 65.0 in | Wt 187.2 lb

## 2012-11-21 DIAGNOSIS — S060X9A Concussion with loss of consciousness of unspecified duration, initial encounter: Secondary | ICD-10-CM

## 2012-11-21 DIAGNOSIS — S0100XA Unspecified open wound of scalp, initial encounter: Secondary | ICD-10-CM

## 2012-11-21 DIAGNOSIS — S0101XD Laceration without foreign body of scalp, subsequent encounter: Secondary | ICD-10-CM

## 2012-11-21 DIAGNOSIS — S060XAA Concussion with loss of consciousness status unknown, initial encounter: Secondary | ICD-10-CM

## 2012-11-21 DIAGNOSIS — S060X0D Concussion without loss of consciousness, subsequent encounter: Secondary | ICD-10-CM
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
## 2012-11-21 NOTE — Patient Instructions (Signed)
May resume regular activity without restrictions. Follow up as needed. Call with questions or concerns.  

## 2014-07-20 ENCOUNTER — Encounter (HOSPITAL_COMMUNITY): Payer: Self-pay | Admitting: *Deleted

## 2014-07-20 ENCOUNTER — Emergency Department (HOSPITAL_COMMUNITY)
Admission: EM | Admit: 2014-07-20 | Discharge: 2014-07-20 | Disposition: A | Discharge status: Home / Self Care | Payer: Medicaid Other | Attending: Emergency Medicine | Admitting: Emergency Medicine

## 2014-07-20 DIAGNOSIS — K029 Dental caries, unspecified: Secondary | ICD-10-CM | POA: Insufficient documentation

## 2014-07-20 DIAGNOSIS — K219 Gastro-esophageal reflux disease without esophagitis: Secondary | ICD-10-CM | POA: Insufficient documentation

## 2014-07-20 DIAGNOSIS — K088 Other specified disorders of teeth and supporting structures: Secondary | ICD-10-CM | POA: Insufficient documentation

## 2014-07-20 DIAGNOSIS — Z792 Long term (current) use of antibiotics: Secondary | ICD-10-CM | POA: Insufficient documentation

## 2014-07-20 DIAGNOSIS — K0889 Other specified disorders of teeth and supporting structures: Secondary | ICD-10-CM

## 2014-07-20 DIAGNOSIS — Z880 Allergy status to penicillin: Secondary | ICD-10-CM | POA: Insufficient documentation

## 2014-07-20 DIAGNOSIS — Z79899 Other long term (current) drug therapy: Secondary | ICD-10-CM | POA: Insufficient documentation

## 2014-07-20 MED ORDER — NAPROXEN 500 MG PO TABS
500.0000 mg | ORAL_TABLET | Freq: Once | ORAL | Status: AC
  Administered 2014-07-20: 500 mg via ORAL
  Filled 2014-07-20: qty 1

## 2014-07-20 MED ORDER — CLINDAMYCIN HCL 150 MG PO CAPS: 300.0000 mg | ORAL_CAPSULE | Freq: Three times a day (TID) | ORAL | Status: DC

## 2014-07-20 MED ORDER — NAPROXEN 500 MG PO TABS: 500.0000 mg | ORAL_TABLET | Freq: Two times a day (BID) | ORAL | Status: DC

## 2014-07-20 MED ORDER — TRAMADOL HCL 50 MG PO TABS: 50.0000 mg | ORAL_TABLET | Freq: Four times a day (QID) | ORAL | Status: DC | PRN

## 2014-07-20 MED ORDER — CLINDAMYCIN HCL 300 MG PO CAPS
300.0000 mg | ORAL_CAPSULE | Freq: Once | ORAL | Status: AC
  Administered 2014-07-20: 300 mg via ORAL
  Filled 2014-07-20: qty 1

## 2014-07-20 NOTE — ED Notes (Signed)
Pt states having R sided dental pain, states its top and bottom, states started a week ago.

## 2014-07-20 NOTE — Discharge Instructions (Signed)
Please follow the directions provided. Use the referrals given or the resource guide below to establish care with a dentist for further treatment of this tooth pain. Please take your antibiotic as directed until it's all gone. Please take the naproxen twice a day to help with inflammation. You may use the Ultram to help with pain not relieved by the naproxen. Don't hesitate to return for any new, worsening, or concerning symptoms.   SEEK IMMEDIATE MEDICAL CARE IF:  You have a fever.  You develop redness and swelling of your face, jaw, or neck.  You are unable to open your mouth.  You have severe pain uncontrolled by pain medicine.    Emergency Department Resource Guide 1) Find a Doctor and Pay Out of Pocket Although you won't have to find out who is covered by your insurance plan, it is a good idea to ask around and get recommendations. You will then need to call the office and see if the doctor you have chosen will accept you as a new patient and what types of options they offer for patients who are self-pay. Some doctors offer discounts or will set up payment plans for their patients who do not have insurance, but you will need to ask so you aren't surprised when you get to your appointment.  2) Contact Your Local Health Department Not all health departments have doctors that can see patients for sick visits, but many do, so it is worth a call to see if yours does. If you don't know where your local health department is, you can check in your phone book. The CDC also has a tool to help you locate your state's health department, and many state websites also have listings of all of their local health departments.  3) Find a Walk-in Clinic If your illness is not likely to be very severe or complicated, you may want to try a walk in clinic. These are popping up all over the country in pharmacies, drugstores, and shopping centers. They're usually staffed by nurse practitioners or physician assistants  that have been trained to treat common illnesses and complaints. They're usually fairly quick and inexpensive. However, if you have serious medical issues or chronic medical problems, these are probably not your best option.  No Primary Care Doctor: - Call Health Connect at  [phone] - they can help you locate a primary care doctor that  accepts your insurance, provides certain services, etc. - Physician Referral Service- [phone]  Chronic Pain Problems: Organization         Address  Phone   Notes  [facility]  [phone] Patients need to be referred by their primary care doctor.   Medication Assistance: Organization         Address  Phone   Notes  [facility] Med[facility] [address] [place] [place], [place] [address] [phone] --Must be a resident of [facility] -- Must have NO insurance coverage whatsoever (no Medicaid/ Medicare, etc.) -- The pt. MUST have a primary care doctor that directs their care regularly and follows them in the community   MedAssist  [phone]   [employer]  [phone]    Agencies that provide inexpensive medical care: Organization         Address  Phone   Notes  [facility]  [phone]   [facility]    [phone]   [facility] [address] [place], [place] [address] [phone]   [facility] [address] [place] [place] [phone]   Planned Parenthood    [phone]   [facility]    [phone]   [facility] and [facility]  [address], [place] Phone:  [phone], Fax:  [phone] Hours of Operation:  9 am - 6 pm, M-F.  Also accepts Medicaid/Medicare and self-pay.  [facility] for [facility]  [address], [place] Phone: [phone], Fax: [phone]. Hours of Operation:  8:30 am - 5:30 pm, M-F.  Also accepts Medicaid  and self-pay.  [facility] [place] [address] [place] Phone: [phone]   [facility] [address] Natasha Bence [place], [place] [phone], Ext. [phone] Mondays & Thursdays: 7-9 AM.  First 15 patients are seen on a first come, first serve basis.    Medicaid-accepting [facility] Providers:  Organization         Address  Phone   Notes  [facility] [address], [place] [phone] Also accepts self-pay patients.  [facility] [address] Laurell Josephs [place], [place]  [phone]   [facility] [address], [place] [phone]   [facility] [address] [place] [phone]   Renaye Rakers [address], [place]   [phone] Only accepts [place] patients after they have their name applied to their card.   Self-Pay (no insurance) in [facility]:  Organization         Address  Phone   Notes  Sickle Cell Patients, [facility] [address] [place], [place] [phone]   [facility] [address] [place], [place] [phone]   [facility] [place]  [address], [place] [phone]   [facility]/Dr. Osei-Bonsu  [address] [place] or [address], [place] [phone] Phone number for both [place] and [place] locations is the same.  Urgent Medical and [facility] [address] [place] [phone]   [facility] [address], [place] or [address] [phone]   [facility] [address], [place] [phone], phone; [phone], fax Sees patients 1st and 3rd Saturday of every month.  Must not qualify for public or private insurance (i.e. Medicaid, Medicare, [place] Health Choice, Veterans' Benefits)  Household income  should be no more than 200% of the poverty level The clinic cannot treat you if you are pregnant or think you are pregnant  Sexually transmitted diseases are not treated at the clinic.    Dental Care: Organization         Address  Phone  Notes  [facility] [address] [place], [place] [phone] Accepts children up to age 85 who are enrolled in [place] or [place] Health Choice; pregnant women with a Medicaid card; and children who have applied for Medicaid or [place] Health Choice, but were declined, whose parents can pay a reduced fee at time of service.  [facility] of [facility]  [address], [place] [phone] Accepts children up to age 41 who are enrolled in [place] or [place]  Health Choice; pregnant women with a Medicaid card; and children who have applied for Medicaid or [place] Health Choice, but were declined, whose parents can pay a reduced fee at time of service.  [facility]  [address], [place] [phone] Patients are seen by appointment only. Walk-ins are not accepted. [facility] will see patients 28 years of age and older. Monday - Tuesday (8am-5pm) Most Wednesdays (8:30-5pm) $30 per visit, cash only  [facility]  [address], [facility] [place] [phone] Patients are seen by appointment only. Walk-ins are not accepted. [facility] will see patients 28 years of age and older. One Wednesday Evening (Monthly: Volunteer Based).  $30 per visit, cash only  [employer] [facility]  [phone] for adults; Children under age 974, call Graduate Pediatric Dentistry at [phone]) [phone]. Children aged 544-14, please call [phone] to request a pediatric application.  Dental services are provided in all areas of dental care including fillings, crowns and bridges, complete and partial dentures, implants, gum treatment,  root canals, and extractions. Preventive care is also provided. Treatment is provided to both adults and children. Patients are selected via a lottery and there is often a waiting list.   [facility] [address], [place]  [phone] [email or web address]   [facility] [address], [place], [place] [phone], Ext. [phone] Second and Fourth Thursday of each month, opens at 6:30 AM; Clinic ends at 9 AM.  Patients are seen on a first-come first-served basis, and a limited number are seen during each clinic.   [facility]  [address], [place], [place] [phone]   Eligibility Requirements You must have lived in [place], [place], or [place] counties for at least the last three months.   You cannot be eligible for state or federal sponsored National Cityhealthcare insurance, including CIGNAVeterans Administration, [place]icaid, or [employer]Medicare.   You generally cannot be eligible for healthcare insurance through your employer.    How to apply: Eligibility screenings are held every Tuesday and Wednesday afternoon from 1:00 pm until 4:00 pm. You do not need an appointment for the interview!  [facility] [address], [place], [place] [phone]   [facility]  [phone]   [facility]  [phone]   [facility]  [phone]    Behavioral Health Resources in the Community: Intensive Outpatient Programs Organization         Address  Phone  Notes  [facility] [address], [place], [place] [phone]   [facility] Outpatient [address], [place], [place] [phone]   [facility]: [facility] S[facility] [address], [place], [place]  [phone]   [facility] [address],  [place], [place] [phone] or [phone]   Substance Abuse Resources Organization         Address  Phone  Notes  [facility] Drug Services   [phone]   [facility]  [phone]   [facility]  [phone]   [facility]  [phone]   Residential & Outpatient Substance Abuse Program  [phone]   Psychological Services Organization         Address  Phone  Notes  [facility]  [phone]- [phone]   [facility]  [phone]- [phone]   [facility] [address], [place] [phone] or [phone]  Mobile Crisis Teams Organization         Address  Phone  Notes  [facility], Mobile Crisis Care Unit  [phone]   [facility]  [address] [place], [place] [phone]   Doristine LocksSharon DeEsch [address] [place] [phone]    Self-Help/Support Groups Organization         Address  Phone             Notes  [facility] [place] - variety of support groups  [phone] Call for more information  Narcotics Anonymous (NA), Caring [facility] [address], [place]  2 meetings at this location   Statisticianesidential Treatment Programs Organization         Address  Phone  Notes  [facility] [address],    [place]  [phone]   [facility]  [address], [place] [phone], [place], [place] [phone]   [facility] [address], [place] [phone] Admissions: 8am-3pm M-F  [facility] [address]    [place], [place] [phone]   [facility] [address], [place], [place] [phone]   [facility] [address]  [place], [place] [phone]   [facility] - Intensive Outpatient [address], [place], [place] [phone]   [facility] ([facility] [address]  [place], [place] [phone] or [phone]   [facility] (RT[facility]) [address] [place], [place] [phone] Accepts Medicaid  [facility] [address]  [place] [phone] Substance Abuse/Addiction Treatment   [facility] Resources Organization         Address  Phone  Notes  [facility]  [phone]) [phone]   Angie FavaJulie Brannon, PhD [address], [address] [place], [place]   [phone] or [phone]   [facility]   [address] [place], [place] [phone]) [phone]   [facility] [address], [place], [place] [phone]) [phone] Insurance/Medicaid/sponsorship through [facility]  Faith and Families [address]                                    [place], [place] [phone]) [phone] Therapy/tele-psych/case  [facility] [address]   [place], [place] [phone]    Dr. Lolly MustacheArfeen  [phone]   [facility] [place]  [employer] [facility] 1) [address],  2) [address], [place] 3)  [address], [place] [phone](336) [phone]  [phone]   [facility] Child Abuse Hotline [phone]) [phone] or [phone]) [phone] (After Hours)

## 2014-07-20 NOTE — ED Provider Notes (Signed)
CSN: [id number]     Arrival date & time 07/20/14  2137 History   First MD Initiated Contact with Patient 07/20/14 2144     Chief Complaint  Patient presents with  . Dental Pain   (Consider location/radiation/quality/duration/timing/severity/associated sxs/prior Treatment) HPI  Manuel Glover is a 29 year old male complaining of dental pain. He states this has been ongoing problem for years but worse in the last week. He states his right upper tooth causing intermittent pain that radiates through his jaw and sometimes behind his ear and up to his forehead. He notices pain on and off for the last few days. He's taken ibuprofen with some relief. But he felt like his cheek was more swollen and is concerned he is getting an infection. He currently rates his pain as a 3 out of 10. He denies any difficulty chewing or swallowing, fevers, chills, nausea or vomiting.  Past Medical History  Diagnosis Date  . GERD (gastroesophageal reflux disease)    Past Surgical History  Procedure Laterality Date  . External ear surgery     Family History  Problem Relation Age of Onset  . Cancer Maternal Uncle     accute lymphoma  . Cancer Maternal Grandmother     lymphoma   History  Substance Use Topics  . Smoking status: Never Smoker   . Smokeless tobacco: Never Used  . Alcohol Use: No    Review of Systems  Constitutional: Negative for fever.  HENT: Positive for dental problem.   Skin: Negative for rash.      Allergies  Penicillins  Home Medications   Prior to Admission medications   Medication Sig Start Date End Date Taking? Authorizing Provider  acetaminophen (TYLENOL) 325 MG tablet Take 2 tablets (650 mg total) by mouth every 4 (four) hours as needed. 11/12/12   Emina Riebock, NP  bacitracin ointment Apply topically 2 (two) times daily. Apply to scalp twice dailiy 11/12/12   Ashok Norris, NP  dicyclomine (BENTYL) 20 MG tablet Take 1 tablet (20 mg total) by mouth 2 (two) times daily.  11/07/12   Lorre Nick, MD  esomeprazole (NEXIUM) 40 MG capsule Take 40 mg by mouth daily before breakfast.    Historical Provider, MD   BP 146/97 mmHg  Pulse 82  Temp(Src) 98.7 F (37.1 C) (Oral)  Resp 18  Ht  (1.651 m)  Wt 195 lb (88.451 kg)  BMI 32.45 kg/m2  SpO2 95% Physical Exam  Constitutional: He appears well-developed and well-nourished. No distress.  HENT:  Head: Normocephalic and atraumatic.  Mouth/Throat: No trismus in the jaw. Dental caries present. No dental abscesses or uvula swelling.    Pain and dental caries to left upper 2nd molar.  No obvious abscess noted  Eyes: Conjunctivae are normal. Right eye exhibits no discharge. Left eye exhibits no discharge. No scleral icterus.  Cardiovascular: Intact distal pulses.   Pulmonary/Chest: Effort normal.  Neurological: He is alert. Coordination normal.  Skin: He is not diaphoretic.  Nursing note and vitals reviewed.   ED Course  Procedures (including critical care time) Labs Review Labs Reviewed - No data to display  Imaging Review No results found.   EKG Interpretation None      MDM   Final diagnoses:  Pain, dental   30 yo with recurrent toothache. Pain has subsided at time of exam. No gross abscess noted or concern for Ludwig's angina or spread of infection. Prescription for clindamycin, NSAIDS and pain medicine prescribed. Dental resources provided and urged patient  to follow-up with dentist.  Pt is well-appearing, in no acute distress and vital signs reviewed and not concerning. He appears safe to be discharged.  Return precautions provided. Pt aware of plan and in agreement.    Filed Vitals:   07/20/14 2146  BP: 146/97  Pulse: 82  Temp: 98.7 F (37.1 C)  TempSrc: Oral  Resp: 18  Height: 5\' 5"  (1.651 m)  Weight: 195 lb (88.451 kg)  SpO2: 95%   Meds given in ED:  Medications  clindamycin (CLEOCIN) capsule 300 mg (300 mg Oral Given 07/20/14 2236)  naproxen (NAPROSYN) tablet 500 mg (500 mg  Oral Given 07/20/14 2236)    Discharge Medication List as of 07/20/2014 10:34 PM    START taking these medications   Details  clindamycin (CLEOCIN) 150 MG capsule Take 2 capsules (300 mg total) by mouth 3 (three) times daily. May dispense as 150mg  capsules, Starting 07/20/2014, Until Discontinued, Print    naproxen (NAPROSYN) 500 MG tablet Take 1 tablet (500 mg total) by mouth 2 (two) times daily., Starting 07/20/2014, Until Discontinued, Print    traMADol (ULTRAM) 50 MG tablet Take 1 tablet (50 mg total) by mouth every 6 (six) hours as needed., Starting 07/20/2014, Until Discontinued, Print           Harle BattiestElizabeth Dulse Rutan, NP 07/21/14 [id number]  Linwood DibblesJon Knapp, MD 07/26/14 [phone]

## 2014-10-03 ENCOUNTER — Emergency Department (HOSPITAL_COMMUNITY): Payer: Medicaid Other

## 2014-10-03 ENCOUNTER — Encounter (HOSPITAL_COMMUNITY): Payer: Self-pay | Admitting: *Deleted

## 2014-10-03 ENCOUNTER — Emergency Department (HOSPITAL_COMMUNITY)
Admission: EM | Admit: 2014-10-03 | Discharge: 2014-10-03 | Disposition: A | Discharge status: Home / Self Care | Payer: Medicaid Other | Attending: Emergency Medicine | Admitting: Emergency Medicine

## 2014-10-03 DIAGNOSIS — K219 Gastro-esophageal reflux disease without esophagitis: Secondary | ICD-10-CM | POA: Insufficient documentation

## 2014-10-03 DIAGNOSIS — K6389 Other specified diseases of intestine: Secondary | ICD-10-CM | POA: Insufficient documentation

## 2014-10-03 DIAGNOSIS — K529 Noninfective gastroenteritis and colitis, unspecified: Secondary | ICD-10-CM

## 2014-10-03 DIAGNOSIS — Z79899 Other long term (current) drug therapy: Secondary | ICD-10-CM | POA: Insufficient documentation

## 2014-10-03 LAB — CBC
HCT: 44 % (ref 39.0–52.0)
HEMOGLOBIN: 15.4 g/dL (ref 13.0–17.0)
MCH: 29.5 pg (ref 26.0–34.0)
MCHC: 35 g/dL (ref 30.0–36.0)
MCV: 84.3 fL (ref 78.0–100.0)
PLATELETS: 263 10*3/uL (ref 150–400)
RBC: 5.22 MIL/uL (ref 4.22–5.81)
RDW: 12.8 % (ref 11.5–15.5)
WBC: 9.2 10*3/uL (ref 4.0–10.5)

## 2014-10-03 LAB — COMPREHENSIVE METABOLIC PANEL
ALBUMIN: 3.8 g/dL (ref 3.5–5.0)
ALT: 13 U/L — AB (ref 17–63)
AST: 15 U/L (ref 15–41)
Alkaline Phosphatase: 54 U/L (ref 38–126)
Anion gap: 7 (ref 5–15)
BUN: 9 mg/dL (ref 6–20)
CALCIUM: 9.3 mg/dL (ref 8.9–10.3)
CO2: 29 mmol/L (ref 22–32)
Chloride: 103 mmol/L (ref 101–111)
Creatinine, Ser: 1.21 mg/dL (ref 0.61–1.24)
GFR calc Af Amer: >60 mL/min (ref >60)
GFR calc non Af Amer: >60 mL/min (ref >60)
Glucose, Bld: 100 mg/dL — ABNORMAL HIGH (ref 65–99)
Potassium: 3.6 mmol/L (ref 3.5–5.1)
Sodium: 139 mmol/L (ref 135–145)
TOTAL PROTEIN: 6.6 g/dL (ref 6.5–8.1)
Total Bilirubin: 0.6 mg/dL (ref 0.3–1.2)

## 2014-10-03 LAB — URINALYSIS, ROUTINE W REFLEX MICROSCOPIC
Bilirubin Urine: NEGATIVE
GLUCOSE, UA: NEGATIVE mg/dL
HGB URINE DIPSTICK: NEGATIVE
Ketones, ur: NEGATIVE mg/dL
LEUKOCYTES UA: NEGATIVE
Nitrite: NEGATIVE
PH: 7 (ref 5.0–8.0)
Protein, ur: NEGATIVE mg/dL
SPECIFIC GRAVITY, URINE: 1.02 (ref 1.005–1.030)
Urobilinogen, UA: 0.2 mg/dL (ref 0.0–1.0)

## 2014-10-03 LAB — LIPASE, BLOOD: Lipase: 22 U/L (ref 22–51)

## 2014-10-03 MED ORDER — IBUPROFEN 800 MG PO TABS: 800.0000 mg | ORAL_TABLET | Freq: Three times a day (TID) | ORAL | Status: DC

## 2014-10-03 MED ORDER — IOHEXOL 300 MG/ML  SOLN
25.0000 mL | Freq: Once | INTRAMUSCULAR | Status: AC | PRN
  Administered 2014-10-03: 25 mL via ORAL

## 2014-10-03 MED ORDER — IOHEXOL 300 MG/ML  SOLN
100.0000 mL | Freq: Once | INTRAMUSCULAR | Status: AC | PRN
  Administered 2014-10-03: 100 mL via INTRAVENOUS

## 2014-10-03 NOTE — ED Provider Notes (Signed)
CSN: [id number]     Arrival date & time 10/03/14  1149 History   First MD Initiated Contact with Patient 10/03/14 1241     Chief Complaint  Patient presents with  . Abdominal Pain     (Consider location/radiation/quality/duration/timing/severity/associated sxs/prior Treatment) HPI Comments: Pt comes in with c/o suprapubic abdominal pain that started this morning. Pt states that he has symptoms with urinating where the pressure feels worse after urinating. Denies vomiting, fever and diarrhea. Pt states that he has no back pain. No history of similar symptoms  The history is provided by the patient. No language interpreter was used.    Past Medical History  Diagnosis Date  . GERD (gastroesophageal reflux disease)    Past Surgical History  Procedure Laterality Date  . External ear surgery     Family History  Problem Relation Age of Onset  . Cancer Maternal Uncle     accute lymphoma  . Cancer Maternal Grandmother     lymphoma   History  Substance Use Topics  . Smoking status: Never Smoker   . Smokeless tobacco: Never Used  . Alcohol Use: No    Review of Systems  All other systems reviewed and are negative.     Allergies  Penicillins  Home Medications   Prior to Admission medications   Medication Sig Start Date End Date Taking? Authorizing Provider  acetaminophen (TYLENOL) 325 MG tablet Take 2 tablets (650 mg total) by mouth every 4 (four) hours as needed. 11/12/12  Yes Emina Riebock, NP  esomeprazole (NEXIUM) 40 MG capsule Take 40 mg by mouth daily before breakfast.   Yes Historical Provider, MD  ibuprofen (ADVIL,MOTRIN) 400 MG tablet Take 400 mg by mouth every 6 (six) hours as needed for mild pain.   Yes Historical Provider, MD  naproxen (NAPROSYN) 500 MG tablet Take 1 tablet (500 mg total) by mouth 2 (two) times daily. 07/20/14  Yes Harle BattiestElizabeth Tysinger, NP  ibuprofen (ADVIL,MOTRIN) 800 MG tablet Take 1 tablet (800 mg total) by mouth 3 (three) times daily. 10/03/14    Teressa LowerVrinda Kandee Escalante, NP   BP 110/67 mmHg  Pulse 77  Temp(Src) 97.8 F (36.6 C) (Oral)  Resp 18  Ht 5\' 5"  (1.651 m)  Wt 190 lb (86.183 kg)  BMI 31.62 kg/m2  SpO2 97% Physical Exam  Constitutional: He is oriented to person, place, and time. He appears well-developed and well-nourished.  Cardiovascular: Normal rate and regular rhythm.   Pulmonary/Chest: Effort normal and breath sounds normal.  Abdominal: Bowel sounds are normal. There is tenderness in the right lower quadrant and suprapubic area.  Musculoskeletal: Normal range of motion.  Neurological: He is alert and oriented to person, place, and time. Coordination normal.  Skin: Skin is warm and dry.  Psychiatric: He has a normal mood and affect.  Nursing note and vitals reviewed.   ED Course  Procedures (including critical care time) Labs Review Labs Reviewed  COMPREHENSIVE METABOLIC PANEL - Abnormal; Notable for the following:    Glucose, Bld 100 (*)    ALT 13 (*)    All other components within normal limits  URINALYSIS, ROUTINE W REFLEX MICROSCOPIC (NOT AT [facility]) - Abnormal; Notable for the following:    APPearance CLOUDY (*)    All other components within normal limits  LIPASE, BLOOD  CBC    Imaging Review Ct Abdomen Pelvis W Contrast  10/03/2014   CLINICAL DATA:  Lower abdominal pain and pelvic pain  EXAM: CT ABDOMEN AND PELVIS WITH CONTRAST  TECHNIQUE: Multidetector  CT imaging of the abdomen and pelvis was performed using the standard protocol following bolus administration of intravenous contrast.  CONTRAST:  OMNIPAQUE IOHEXOL 300 MG/ML  SOLN  COMPARISON:  None  FINDINGS: Lower chest:  No pleural or pericardial effusion.  Hepatobiliary: No suspicious liver abnormalities identified. The gallbladder appears normal. There is no biliary dilatation.  Pancreas: Normal appearance of the pancreas.  Spleen: The spleen is on unremarkable.  Adrenals/Urinary Tract: Normal appearance of the adrenal glands. The kidneys are both  normal. The urinary bladder appears normal.  Stomach/Bowel: The stomach and the small bowel loops have a normal course and caliber. The appendix is upper limits of normal in caliber measuring 7 mm, image 58 of series 5. No periappendiceal fat stranding or free fluid. Ovoid fat attenuation structure adjacent to the sigmoid colon measures 2.3 cm, image 69/ series 2. This has a rim of soft tissue and there is surrounding fat stranding. This is compatible with epiploic appendagitis.  Vascular/Lymphatic: Normal appearance of the abdominal aorta. No enlarged retroperitoneal or mesenteric adenopathy. No enlarged pelvic or inguinal lymph nodes.  Reproductive: The prostate gland and seminal vesicles are unremarkable.  Other: There is no ascites or focal fluid collections within the abdomen or pelvis.  Musculoskeletal: Review of the visualized bony structures is negative.  IMPRESSION: 1. Epiploic appendagitis involving the sigmoid colon. 2. No evidence for appendicitis.   Electronically Signed   By: Signa Kell M.D.   On: 10/03/2014 13:50     EKG Interpretation None      MDM   Final diagnoses:  Epiploic appendagitis    Will treat with ibuprofen. Discussed return precautions with pt including fever and worsening symptoms. Pt verbalized understanding    Teressa Lower, NP 10/03/14 1530  Nelva Nay, MD 10/04/14 [phone]

## 2014-10-03 NOTE — ED Notes (Signed)
Pt reports lower abd and pelvic pain in the morning and after urinating. Having mild nausea, denies vomiting or diarrhea.

## 2014-10-03 NOTE — Discharge Instructions (Signed)
Follow up for continued or worsening symptoms as discussed.  Colitis Colitis is inflammation of the colon. Colitis can be a short-term or long-standing (chronic) illness. Crohn's disease and ulcerative colitis are 2 types of colitis which are chronic. They usually require lifelong treatment. CAUSES  There are many different causes of colitis, including:  Viruses.  Germs (bacteria).  Medicine reactions. SYMPTOMS   Diarrhea.  Intestinal bleeding.  Pain.  Fever.  Throwing up (vomiting).  Tiredness (fatigue).  Weight loss.  Bowel blockage. DIAGNOSIS  The diagnosis of colitis is based on examination and stool or blood tests. X-rays, CT scan, and colonoscopy may also be needed. TREATMENT  Treatment may include:  Fluids given through the vein (intravenously).  Bowel rest (nothing to eat or drink for a period of time).  Medicine for pain and diarrhea.  Medicines (antibiotics) that kill germs.  Cortisone medicines.  Surgery. HOME CARE INSTRUCTIONS   Get plenty of rest.  Drink enough water and fluids to keep your urine clear or pale yellow.  Eat a well-balanced diet.  Call your caregiver for follow-up as recommended. SEEK IMMEDIATE MEDICAL CARE IF:   You develop chills.  You have an oral temperature above 102 F (38.9 C), not controlled by medicine.  You have extreme weakness, fainting, or dehydration.  You have repeated vomiting.  You develop severe belly (abdominal) pain or are passing bloody or tarry stools. MAKE SURE YOU:   Understand these instructions.  Will watch your condition.  Will get help right away if you are not doing well or get worse. Document Released: 04/12/2004 Document Revised: 05/28/2011 Document Reviewed: 07/08/2009 [facility] Patient Information 2015 [place]. This information is not intended to replace advice given to you by your health care provider. Make sure you discuss any questions you have with your health care  provider.

## 2015-03-16 ENCOUNTER — Emergency Department (HOSPITAL_COMMUNITY): Payer: Medicaid Other

## 2015-03-16 ENCOUNTER — Encounter (HOSPITAL_COMMUNITY): Payer: Self-pay | Admitting: Emergency Medicine

## 2015-03-16 ENCOUNTER — Emergency Department (HOSPITAL_COMMUNITY)
Admission: EM | Admit: 2015-03-16 | Discharge: 2015-03-16 | Disposition: A | Discharge status: Home / Self Care | Payer: Medicaid Other | Attending: Emergency Medicine | Admitting: Emergency Medicine

## 2015-03-16 DIAGNOSIS — Z880 Allergy status to penicillin: Secondary | ICD-10-CM | POA: Insufficient documentation

## 2015-03-16 DIAGNOSIS — Z79899 Other long term (current) drug therapy: Secondary | ICD-10-CM | POA: Insufficient documentation

## 2015-03-16 DIAGNOSIS — Z791 Long term (current) use of non-steroidal anti-inflammatories (NSAID): Secondary | ICD-10-CM | POA: Insufficient documentation

## 2015-03-16 DIAGNOSIS — R05 Cough: Secondary | ICD-10-CM

## 2015-03-16 DIAGNOSIS — J40 Bronchitis, not specified as acute or chronic: Secondary | ICD-10-CM

## 2015-03-16 DIAGNOSIS — R059 Cough, unspecified: Secondary | ICD-10-CM

## 2015-03-16 DIAGNOSIS — K219 Gastro-esophageal reflux disease without esophagitis: Secondary | ICD-10-CM | POA: Insufficient documentation

## 2015-03-16 DIAGNOSIS — J209 Acute bronchitis, unspecified: Secondary | ICD-10-CM | POA: Insufficient documentation

## 2015-03-16 MED ORDER — AZITHROMYCIN 250 MG PO TABS: ORAL_TABLET | ORAL | Status: DC

## 2015-03-16 MED ORDER — BENZONATATE 100 MG PO CAPS: 100.0000 mg | ORAL_CAPSULE | Freq: Three times a day (TID) | ORAL | Status: DC

## 2015-03-16 MED ORDER — ALBUTEROL SULFATE HFA 108 (90 BASE) MCG/ACT IN AERS
2.0000 | INHALATION_SPRAY | RESPIRATORY_TRACT | Status: DC | PRN
  Administered 2015-03-16: 2 via RESPIRATORY_TRACT
  Filled 2015-03-16: qty 6.7

## 2015-03-16 NOTE — ED Provider Notes (Signed)
CSN: [id number]     Arrival date & time 03/16/15  1105 History   First MD Initiated Contact with Patient 03/16/15 1121     Chief Complaint  Patient presents with  . Nasal Congestion  . Cough    productive      (Consider location/radiation/quality/duration/timing/severity/associated sxs/prior Treatment) HPI Comments: Patient presents with complaints of cough, chest tightness, nasal congestion lasting for approximately the past 6-8 weeks. Patient states that he has trouble producing mucus. He has been using over-the-counter medications without relief. No fevers. Patient states that he is mainly concerned about having pneumonia. Patient states that he has had several days of a constant, "cold sensation" in the lower part of his chest. No associated shortness of breath or radiation of the symptoms. Patient states that this sensation is slightly better with food but worsens a while after eating. No history of blood clots. No history of CAD in any young first degree relatives. Patient has been using over-the-counter medications such as Mucinex without relief. The onset of this condition was acute. The course is constant. Aggravating factors: none. Alleviating factors: none.    The history is provided by the patient and a relative.    Past Medical History  Diagnosis Date  . GERD (gastroesophageal reflux disease)    Past Surgical History  Procedure Laterality Date  . External ear surgery     Family History  Problem Relation Age of Onset  . Cancer Maternal Uncle     accute lymphoma  . Cancer Maternal Grandmother     lymphoma   Social History  Substance Use Topics  . Smoking status: Never Smoker   . Smokeless tobacco: Never Used  . Alcohol Use: No    Review of Systems  Constitutional: Negative for fever, chills and fatigue.  HENT: Positive for congestion, rhinorrhea and sinus pressure. Negative for ear pain and sore throat.   Eyes: Negative for redness.  Respiratory: Positive for  cough, chest tightness and wheezing (at night). Negative for shortness of breath.   Gastrointestinal: Negative for nausea, vomiting, abdominal pain and diarrhea.  Genitourinary: Negative for dysuria.  Musculoskeletal: Negative for myalgias and neck stiffness.  Skin: Negative for rash.  Neurological: Negative for headaches.  Hematological: Negative for adenopathy.      Allergies  Penicillins  Home Medications   Prior to Admission medications   Medication Sig Start Date End Date Taking? Authorizing Provider  acetaminophen (TYLENOL) 325 MG tablet Take 2 tablets (650 mg total) by mouth every 4 (four) hours as needed. 11/12/12   Emina Riebock, NP  esomeprazole (NEXIUM) 40 MG capsule Take 40 mg by mouth daily before breakfast.    Historical Provider, MD  ibuprofen (ADVIL,MOTRIN) 400 MG tablet Take 400 mg by mouth every 6 (six) hours as needed for mild pain.    Historical Provider, MD  ibuprofen (ADVIL,MOTRIN) 800 MG tablet Take 1 tablet (800 mg total) by mouth 3 (three) times daily. 10/03/14   Teressa Lower, NP  naproxen (NAPROSYN) 500 MG tablet Take 1 tablet (500 mg total) by mouth 2 (two) times daily. 07/20/14   Harle Battiest, NP   BP 123/86 mmHg  Pulse 84  Temp(Src) 97.8 F (36.6 C) (Oral)  Resp 18  SpO2 98% Physical Exam  Constitutional: He appears well-developed and well-nourished.  HENT:  Head: Normocephalic and atraumatic.  Right Ear: Tympanic membrane, external ear and ear canal normal.  Left Ear: Tympanic membrane, external ear and ear canal normal.  Nose: Mucosal edema present. No rhinorrhea.  Mouth/Throat:  Uvula is midline, oropharynx is clear and moist and mucous membranes are normal. Mucous membranes are not dry. No trismus in the jaw. No uvula swelling. No oropharyngeal exudate, posterior oropharyngeal edema, posterior oropharyngeal erythema or tonsillar abscesses.  Eyes: Conjunctivae are normal. Right eye exhibits no discharge. Left eye exhibits no discharge.   Neck: Normal range of motion. Neck supple.  Cardiovascular: Normal rate, regular rhythm and normal heart sounds.   No murmur heard. Pulmonary/Chest: Effort normal and breath sounds normal. No respiratory distress. He has no wheezes. He has no rales.  Abdominal: Soft. There is no tenderness.  Neurological: He is alert.  Skin: Skin is warm and dry.  Psychiatric: He has a normal mood and affect.  Nursing note and vitals reviewed.   ED Course  Procedures (including critical care time) Labs Review Labs Reviewed - No data to display  Imaging Review Dg Chest 2 View  03/16/2015  CLINICAL DATA:  Cough for 2 months, burning sensation throughout chest beginning 4 days ago. EXAM: CHEST  2 VIEW COMPARISON:  None. FINDINGS: The heart size and mediastinal contours are within normal limits. Both lungs are clear. The visualized skeletal structures are unremarkable. IMPRESSION: No active cardiopulmonary disease. Electronically Signed   By: Charlett NoseKevin  Dover M.D.   On: 03/16/2015 12:35   I have personally reviewed and evaluated these images and lab results as part of my medical decision-making.   EKG Interpretation None       12:03 PM Patient seen and examined. Work-up initiated. Will obtain CXR.   Patient counseled on use of albuterol HFA. Instructed to use 1-2 puffs q 4 hours as needed for SOB.  Vital signs reviewed and are as follows: BP 123/86 mmHg  Pulse 84  Temp(Src) 97.8 F (36.6 C) (Oral)  Resp 18  SpO2 98%  1:21 PM Patient and family at bedside updated on results. Patient states that the chest sensation is better after administration of albuterol.   Patient counseled on supportive care for URI and s/s to return including worsening symptoms, persistent fever, persistent vomiting, or if they have any other concerns. Urged to see PCP if symptoms persist for more than 3 days. Patient verbalizes understanding and agrees with plan.   MDM   Final diagnoses:  Bronchitis  Cough   Patient  with protracted course of URI symptoms with cough, most consistent with bronchitis. For this reason symptomatic treatment given with albuterol, Tessalon. Also course of antibiotics trial given due to duration of symptoms with azithromycin. Chest x-ray does not demonstrate pneumonia. Patient has had constant chest 'cold sensation' resolved in the ED with albuterol. Story and history would be very atypical for acute coronary syndrome or PE. Patient also has a history of GERD and takes PPI. He states that his symptoms are atypical for previous GERD symptoms. Would consider this as potential etiology if not improved with current symptoms. Patient does have PCP follow-up in one week. Encouraged recheck at that time.    Renne CriglerJoshua Edelmiro Innocent, PA-C 03/16/15 1324  Donnetta HutchingBrian Cook, MD 03/17/15 [phone]

## 2015-03-16 NOTE — ED Notes (Signed)
Pt comes to Ed with c/o of cold and congestion lasting over a month.

## 2015-03-16 NOTE — Discharge Instructions (Signed)
Please read and follow all provided instructions.  Your diagnoses today include:  1. Bronchitis   2. Cough    Tests performed today include:  Chest x-ray - does not show any pneumonia  Vital signs. See below for your results today.   Medications prescribed:   Azithromycin - antibiotic for respiratory infection  You have been prescribed an antibiotic medicine: take the entire course of medicine even if you are feeling better. Stopping early can cause the antibiotic not to work.   Tessalon Perles - cough suppressant medication    Albuterol inhaler - medication that opens up your airway  Use inhaler as follows: 1-2 puffs with spacer every 4 hours as needed for wheezing, cough, or shortness of breath.   Take any prescribed medications only as directed.  Home care instructions:  Follow any educational materials contained in this packet.  Follow-up instructions: Please follow-up with your primary care provider in the next 3 days for further evaluation of your symptoms and a recheck if you are not feeling better.   Return instructions:   Please return to the Emergency Department if you experience worsening symptoms.  Please return with worsening wheezing, shortness of breath, or difficulty breathing.  Return with persistent fever above 101F.   Please return if you have any other emergent concerns.  Additional Information:  Your vital signs today were: BP 123/86 mmHg   Pulse 84   Temp(Src) 97.8 F (36.6 C) (Oral)   Resp 18   SpO2 98% If your blood pressure (BP) was elevated above 135/85 this visit, please have this repeated by your doctor within one month. --------------

## 2015-06-12 ENCOUNTER — Emergency Department (HOSPITAL_COMMUNITY)
Admission: EM | Admit: 2015-06-12 | Discharge: 2015-06-12 | Disposition: A | Discharge status: Home / Self Care | Payer: BLUE CROSS/BLUE SHIELD | Attending: Emergency Medicine | Admitting: Emergency Medicine

## 2015-06-12 ENCOUNTER — Encounter (HOSPITAL_COMMUNITY): Payer: Self-pay

## 2015-06-12 DIAGNOSIS — R110 Nausea: Secondary | ICD-10-CM | POA: Insufficient documentation

## 2015-06-12 DIAGNOSIS — R1030 Lower abdominal pain, unspecified: Secondary | ICD-10-CM | POA: Insufficient documentation

## 2015-06-12 DIAGNOSIS — Z880 Allergy status to penicillin: Secondary | ICD-10-CM | POA: Diagnosis not present

## 2015-06-12 DIAGNOSIS — K219 Gastro-esophageal reflux disease without esophagitis: Secondary | ICD-10-CM | POA: Diagnosis not present

## 2015-06-12 DIAGNOSIS — R197 Diarrhea, unspecified: Secondary | ICD-10-CM

## 2015-06-12 DIAGNOSIS — Z79899 Other long term (current) drug therapy: Secondary | ICD-10-CM | POA: Insufficient documentation

## 2015-06-12 LAB — CBC WITH DIFFERENTIAL/PLATELET
BASOS PCT: 0 %
Basophils Absolute: 0 10*3/uL (ref 0.0–0.1)
EOS ABS: 0.1 10*3/uL (ref 0.0–0.7)
Eosinophils Relative: 1 %
HCT: 44.8 % (ref 39.0–52.0)
HEMOGLOBIN: 16.1 g/dL (ref 13.0–17.0)
LYMPHS ABS: 1.6 10*3/uL (ref 0.7–4.0)
Lymphocytes Relative: 17 %
MCH: 30 pg (ref 26.0–34.0)
MCHC: 35.9 g/dL (ref 30.0–36.0)
MCV: 83.6 fL (ref 78.0–100.0)
Monocytes Absolute: 0.9 10*3/uL (ref 0.1–1.0)
Monocytes Relative: 10 %
NEUTROS PCT: 72 %
Neutro Abs: 6.6 10*3/uL (ref 1.7–7.7)
Platelets: 250 10*3/uL (ref 150–400)
RBC: 5.36 MIL/uL (ref 4.22–5.81)
RDW: 12.8 % (ref 11.5–15.5)
WBC: 9.2 10*3/uL (ref 4.0–10.5)

## 2015-06-12 LAB — COMPREHENSIVE METABOLIC PANEL
ALBUMIN: 4.4 g/dL (ref 3.5–5.0)
ALK PHOS: 55 U/L (ref 38–126)
ALT: 29 U/L (ref 17–63)
AST: 28 U/L (ref 15–41)
Anion gap: 8 (ref 5–15)
BUN: 11 mg/dL (ref 6–20)
CALCIUM: 9.1 mg/dL (ref 8.9–10.3)
CO2: 24 mmol/L (ref 22–32)
CREATININE: 1.05 mg/dL (ref 0.61–1.24)
Chloride: 106 mmol/L (ref 101–111)
GFR calc Af Amer: >60 mL/min (ref >60)
GFR calc non Af Amer: >60 mL/min (ref >60)
GLUCOSE: 95 mg/dL (ref 65–99)
Potassium: 4.2 mmol/L (ref 3.5–5.1)
SODIUM: 138 mmol/L (ref 135–145)
Total Bilirubin: 0.7 mg/dL (ref 0.3–1.2)
Total Protein: 7.9 g/dL (ref 6.5–8.1)

## 2015-06-12 LAB — URINALYSIS, ROUTINE W REFLEX MICROSCOPIC
BILIRUBIN URINE: NEGATIVE
Glucose, UA: NEGATIVE mg/dL
HGB URINE DIPSTICK: NEGATIVE
Ketones, ur: NEGATIVE mg/dL
Leukocytes, UA: NEGATIVE
Nitrite: NEGATIVE
PH: 5.5 (ref 5.0–8.0)
Protein, ur: NEGATIVE mg/dL
Specific Gravity, Urine: 1.026 (ref 1.005–1.030)

## 2015-06-12 LAB — LIPASE, BLOOD: Lipase: 27 U/L (ref 11–51)

## 2015-06-12 MED ORDER — SODIUM CHLORIDE 0.9 % IV BOLUS (SEPSIS)
1000.0000 mL | Freq: Once | INTRAVENOUS | Status: AC
  Administered 2015-06-12: 1000 mL via INTRAVENOUS

## 2015-06-12 MED ORDER — ONDANSETRON HCL 4 MG/2ML IJ SOLN
4.0000 mg | INTRAMUSCULAR | Status: AC
  Administered 2015-06-12: 4 mg via INTRAVENOUS
  Filled 2015-06-12: qty 2

## 2015-06-12 MED ORDER — ONDANSETRON 4 MG PO TBDP: 4.0000 mg | ORAL_TABLET | Freq: Three times a day (TID) | ORAL | Status: DC | PRN

## 2015-06-12 NOTE — ED Notes (Signed)
Awake. Verbally responsive. A/O x4. Resp even and unlabored. No audible adventitious breath sounds noted. ABC's intact.  

## 2015-06-12 NOTE — Discharge Instructions (Signed)
1. Medications: zofran for nausea, usual home medications 2. Treatment: rest, drink plenty of fluids 3. Follow Up: please followup with your primary doctor in 2-3 days for discussion of your diagnoses and further evaluation after today's visit; if you do not have a primary care doctor use the phone number listed in your discharge paperwork to find one; please return to the ER for high fever, severe abdominal pain, persistent vomiting, new or worsening symptoms    Diarrhea Diarrhea is watery poop (stool). It can make you feel weak, tired, thirsty, or give you a dry mouth (signs of dehydration). Watery poop is a sign of another problem, most often an infection. It often lasts 2-3 days. It can last longer if it is a sign of something serious. Take care of yourself as told by your doctor. HOME CARE   Drink 1 cup (8 ounces) of fluid each time you have watery poop.  Do not drink the following fluids:  Those that contain simple sugars (fructose, glucose, galactose, lactose, sucrose, maltose).  Sports drinks.  Fruit juices.  Whole milk products.  Sodas.  Drinks with caffeine (coffee, tea, soda) or alcohol.  Oral rehydration solution may be used if the doctor says it is okay. You may make your own solution. Follow this recipe:   - teaspoon table salt.   teaspoon baking soda.   teaspoon salt substitute containing potassium chloride.  1 tablespoons sugar.  1 liter (34 ounces) of water.  Avoid the following foods:  High fiber foods, such as raw fruits and vegetables.  Nuts, seeds, and whole grain breads and cereals.   Those that are sweetened with sugar alcohols (xylitol, sorbitol, mannitol).  Try eating the following foods:  Starchy foods, such as rice, toast, pasta, low-sugar cereal, oatmeal, baked potatoes, crackers, and bagels.  Bananas.  Applesauce.  Eat probiotic-rich foods, such as yogurt and milk products that are fermented.  Wash your hands well after each time  you have watery poop.  Only take medicine as told by your doctor.  Take a warm bath to help lessen burning or pain from having watery poop. GET HELP RIGHT AWAY IF:   You cannot drink fluids without throwing up (vomiting).  You keep throwing up.  You have blood in your poop, or your poop looks black and tarry.  You do not pee (urinate) in 6-8 hours, or there is only a small amount of very dark pee.  You have belly (abdominal) pain that gets worse or stays in the same spot (localizes).  You are weak, dizzy, confused, or light-headed.  You have a very bad headache.  Your watery poop gets worse or does not get better.  You have a fever or lasting symptoms for more than 2-3 days.  You have a fever and your symptoms suddenly get worse. MAKE SURE YOU:   Understand these instructions.  Will watch your condition.  Will get help right away if you are not doing well or get worse.   This information is not intended to replace advice given to you by your health care provider. Make sure you discuss any questions you have with your health care provider.   Document Released: 08/22/2007 Document Revised: 03/26/2014 Document Reviewed: 11/11/2011 Elsevier Interactive Patient Education [employer]

## 2015-06-12 NOTE — ED Notes (Signed)
He states he has had diarrhea avg. 5-10 times per day since Wed.  He is in no distress.  He c/o diffuse low abd. Discomfort R = L.

## 2015-06-12 NOTE — ED Provider Notes (Signed)
CSN: [id number]     Arrival date & time 06/12/15  1003 History   First MD Initiated Contact with Patient 06/12/15 1017     Chief Complaint  Patient presents with  . Diarrhea    HPI   Manuel Glover is a 30 y.o. male with a PMH of GERD who presents to the ED with diarrhea, which he states started Wednesday and has been intermittent since that time. He reports 5-10 episodes of loose stools daily. He denies hematochezia or melena. He reports mild suprapubic abdominal pain as well as nausea. He denies fever, chills, vomiting. He denies exacerbating factors. He states he has not tried anything for symptom relief. He denies recent travel, recent hospitalization, recent antibiotic use.   Past Medical History  Diagnosis Date  . GERD (gastroesophageal reflux disease)    Past Surgical History  Procedure Laterality Date  . External ear surgery     Family History  Problem Relation Age of Onset  . Cancer Maternal Uncle     accute lymphoma  . Cancer Maternal Grandmother     lymphoma   Social History  Substance Use Topics  . Smoking status: Never Smoker   . Smokeless tobacco: Never Used  . Alcohol Use: No      Review of Systems  Constitutional: Negative for fever and chills.  Gastrointestinal: Positive for nausea, abdominal pain and diarrhea. Negative for vomiting, constipation and blood in stool.  Genitourinary: Negative for dysuria, urgency and frequency.  All other systems reviewed and are negative.     Allergies  Penicillins  Home Medications   Prior to Admission medications   Medication Sig Start Date End Date Taking? Authorizing Provider  omeprazole (PRILOSEC OTC) 20 MG tablet Take 20 mg by mouth daily.   Yes Historical Provider, MD  acetaminophen (TYLENOL) 325 MG tablet Take 2 tablets (650 mg total) by mouth every 4 (four) hours as needed. Patient not taking: Reported on 06/12/2015 11/12/12   Ashok NorrisEmina Riebock, NP  azithromycin (ZITHROMAX Z-PAK) 250 MG tablet Take two tablets  PO on day 1 and one tablet PO days 2-5 Patient not taking: Reported on 06/12/2015 03/16/15   Renne CriglerJoshua Geiple, PA-C  benzonatate (TESSALON) 100 MG capsule Take 1 capsule (100 mg total) by mouth every 8 (eight) hours. Patient not taking: Reported on 06/12/2015 03/16/15   Renne CriglerJoshua Geiple, PA-C  ibuprofen (ADVIL,MOTRIN) 800 MG tablet Take 1 tablet (800 mg total) by mouth 3 (three) times daily. Patient not taking: Reported on 06/12/2015 10/03/14   Teressa LowerVrinda Pickering, NP  naproxen (NAPROSYN) 500 MG tablet Take 1 tablet (500 mg total) by mouth 2 (two) times daily. Patient not taking: Reported on 06/12/2015 07/20/14   Harle BattiestElizabeth Tysinger, NP  ondansetron (ZOFRAN ODT) 4 MG disintegrating tablet Take 1 tablet (4 mg total) by mouth every 8 (eight) hours as needed for nausea. 06/12/15   Mady GemmaElizabeth C Westfall, PA-C    BP 136/93 mmHg  Pulse 94  Temp(Src) 97.9 F (36.6 C) (Oral)  Resp 18  SpO2 100% Physical Exam  Constitutional: He is oriented to person, place, and time. He appears well-developed and well-nourished. No distress.  HENT:  Head: Normocephalic and atraumatic.  Right Ear: External ear normal.  Left Ear: External ear normal.  Nose: Nose normal.  Mouth/Throat: Uvula is midline, oropharynx is clear and moist and mucous membranes are normal.  Eyes: Conjunctivae, EOM and lids are normal. Pupils are equal, round, and reactive to light. Right eye exhibits no discharge. Left eye exhibits no discharge. No  scleral icterus.  Neck: Normal range of motion. Neck supple.  Cardiovascular: Normal rate, regular rhythm, normal heart sounds, intact distal pulses and normal pulses.   Pulmonary/Chest: Effort normal and breath sounds normal. No respiratory distress. He has no wheezes. He has no rales.  Abdominal: Soft. Normal appearance and bowel sounds are normal. He exhibits no distension and no mass. There is no tenderness. There is no rigidity, no rebound and no guarding.  Abdomen soft, nontender, nondistended.   Musculoskeletal: Normal range of motion. He exhibits no edema or tenderness.  Neurological: He is alert and oriented to person, place, and time.  Skin: Skin is warm, dry and intact. No rash noted. He is not diaphoretic. No erythema. No pallor.  Psychiatric: He has a normal mood and affect. His speech is normal and behavior is normal.  Nursing note and vitals reviewed.   ED Course  Procedures (including critical care time)  Labs Review Labs Reviewed  CBC WITH DIFFERENTIAL/PLATELET  COMPREHENSIVE METABOLIC PANEL  LIPASE, BLOOD  URINALYSIS, ROUTINE W REFLEX MICROSCOPIC (NOT AT [facility])    Imaging Review No results found.   I have personally reviewed and evaluated these lab results as part of my medical decision-making.   EKG Interpretation None      MDM   Final diagnoses:  Diarrhea, unspecified type    30 year old male presents with diarrhea since Wednesday. Initially noted lower abdominal pain, though states this is now resolved. Reports nausea. Denies vomiting, hematochezia, melena, dysuria, urgency, frequency, recent antibiotic use, recent hospitalization, recent travel. Patient is afebrile. Vital signs stable. Abdomen soft, non-tender, non-distended. No rebound, guarding, or masses.   Patient given fluids and antiemetic.  CBC negative for leukocytosis. CMP unremarkable. Lipase within normal limits. UA negative for infection.   Discussed findings with patient. On reassessment, he reports symptom improvement. Patient is nontoxic and well-appearing, feel he is stable for discharge at this time. Symptoms likely due to viral gastroenteritis. Advised to increase fluid intake. Will give zofran for home. Patient to follow-up with PCP. Return precautions discussed. Patient verbalizes his understanding and is in agreement with plan.  BP 136/93 mmHg  Pulse 94  Temp(Src) 97.9 F (36.6 C) (Oral)  Resp 18  SpO2 100%     Mady Gemma, PA-C 06/12/15 1226  Cathren Laine,  MD 06/14/15 1141

## 2015-06-12 NOTE — ED Notes (Signed)
Iv d/c.

## 2015-07-12 ENCOUNTER — Ambulatory Visit (INDEPENDENT_AMBULATORY_CARE_PROVIDER_SITE_OTHER): Payer: Worker's Compensation | Admitting: Urgent Care

## 2015-07-12 VITALS — BP 122/80 | HR 102 | Temp 98.0°F | Resp 18

## 2015-07-12 DIAGNOSIS — S61412A Laceration without foreign body of left hand, initial encounter: Secondary | ICD-10-CM | POA: Diagnosis not present

## 2015-07-12 DIAGNOSIS — M79642 Pain in left hand: Secondary | ICD-10-CM

## 2015-07-12 NOTE — Progress Notes (Signed)
    MRN: [id number] DOB: 1985/06/28  Subjective:   Manuel Glover is a 30 y.o. male presenting for chief complaint of Hand Injury  Reports suffering a laceration while at work today. Patient was using a knife to remove zip ties from a box, lost control of the knife and punctured his left dorsal surface of his hand. He came to the clinic immediately. Reports that he had profuse bleeding, severe pain. Denies loss of range of motion, decreased sensation. TDAP was updated 2013.  Manuel Glover has a current medication list which includes the following prescription(s): acetaminophen, azithromycin, benzonatate, ibuprofen, naproxen, omeprazole, and ondansetron. Also is allergic to penicillins.  Manuel Glover  has a past medical history of GERD (gastroesophageal reflux disease). Also  has past surgical history that includes External ear surgery.  Objective:   Vitals: BP 122/80 mmHg  Pulse 102  Temp(Src) 98 F (36.7 C) (Oral)  Resp 18  SpO2 98%  Physical Exam  Constitutional: He is oriented to person, place, and time. He appears well-developed and well-nourished.  Cardiovascular: Normal rate.   Pulmonary/Chest: Effort normal.  Musculoskeletal:       Left hand: He exhibits tenderness and laceration. He exhibits normal range of motion, normal capillary refill, no deformity and no swelling. Normal sensation noted. Normal strength noted.       Hands: Neurological: He is alert and oriented to person, place, and time.  Skin: Skin is warm and dry.   PROCEDURE NOTE: laceration repair Verbal consent obtained from patient.  Local anesthesia with 3cc Lidocaine 2% without epinephrine.  Wound explored for tendon, ligament damage. Wound scrubbed with soap and water and rinsed. Wound closed with #3 4-0 Prolene (2 horizontal mattress and 1 simple interrupted) sutures.  Wound cleansed and dressed.  Assessment and Plan :   1. Hand laceration, left, initial encounter 2. Left hand pain - Stable, laceration repaired.  Counseled on wound care. Patient is to be on light duty for left hand until sutures are removed.  Wallis BambergMario Yolanda Huffstetler, PA-C [facility] [phone] 07/12/2015 3:31 PM

## 2015-07-12 NOTE — Patient Instructions (Addendum)
Laceration Care, Adult A laceration is a cut that goes through all of the layers of the skin and into the tissue that is right under the skin. Some lacerations heal on their own. Others need to be closed with stitches (sutures), staples, skin adhesive strips, or skin glue. Proper laceration care minimizes the risk of infection and helps the laceration to heal better. HOW TO CARE FOR YOUR LACERATION If sutures or staples were used:  Keep the wound clean and dry.  If you were given a bandage (dressing), you should change it at least one time per day or as told by your health care provider. You should also change it if it becomes wet or dirty.  Keep the wound completely dry for the first 24 hours or as told by your health care provider. After that time, you may shower or bathe. However, make sure that the wound is not soaked in water until after the sutures or staples have been removed.  Clean the wound one time each day or as told by your health care provider:  Wash the wound with soap and water.  Rinse the wound with water to remove all soap.  Pat the wound dry with a clean towel. Do not rub the wound.  After cleaning the wound, apply a thin layer of antibiotic ointmentas told by your health care provider. This will help to prevent infection and keep the dressing from sticking to the wound.  Have the sutures or staples removed as told by your health care provider. If skin adhesive strips were used:  Keep the wound clean and dry.  If you were given a bandage (dressing), you should change it at least one time per day or as told by your health care provider. You should also change it if it becomes dirty or wet.  Do not get the skin adhesive strips wet. You may shower or bathe, but be careful to keep the wound dry.  If the wound gets wet, pat it dry with a clean towel. Do not rub the wound.  Skin adhesive strips fall off on their own. You may trim the strips as the wound heals. Do not  remove skin adhesive strips that are still stuck to the wound. They will fall off in time. If skin glue was used:  Try to keep the wound dry, but you may briefly wet it in the shower or bath. Do not soak the wound in water, such as by swimming.  After you have showered or bathed, gently pat the wound dry with a clean towel. Do not rub the wound.  Do not do any activities that will make you sweat heavily until the skin glue has fallen off on its own.  Do not apply liquid, cream, or ointment medicine to the wound while the skin glue is in place. Using those may loosen the film before the wound has healed.  If you were given a bandage (dressing), you should change it at least one time per day or as told by your health care provider. You should also change it if it becomes dirty or wet.  If a dressing is placed over the wound, be careful not to apply tape directly over the skin glue. Doing that may cause the glue to be pulled off before the wound has healed.  Do not pick at the glue. The skin glue usually remains in place for 5-10 days, then it falls off of the skin. General Instructions  Take over-the-counter and prescription   medicines only as told by your health care provider.  If you were prescribed an antibiotic medicine or ointment, take or apply it as told by your doctor. Do not stop using it even if your condition improves.  To help prevent scarring, make sure to cover your wound with sunscreen whenever you are outside after stitches are removed, after adhesive strips are removed, or when glue remains in place and the wound is healed. Make sure to wear a sunscreen of at least 30 SPF.  Do not scratch or pick at the wound.  Keep all follow-up visits as told by your health care provider. This is important.  Check your wound every day for signs of infection. Watch for:  Redness, swelling, or pain.  Fluid, blood, or pus.  Raise (elevate) the injured area above the level of your heart  while you are sitting or lying down, if possible. SEEK MEDICAL CARE IF:  You received a tetanus shot and you have swelling, severe pain, redness, or bleeding at the injection site.  You have a fever.  A wound that was closed breaks open.  You notice a bad smell coming from your wound or your dressing.  You notice something coming out of the wound, such as wood or glass.  Your pain is not controlled with medicine.  You have increased redness, swelling, or pain at the site of your wound.  You have fluid, blood, or pus coming from your wound.  You notice a change in the color of your skin near your wound.  You need to change the dressing frequently due to fluid, blood, or pus draining from the wound.  You develop a new rash.  You develop numbness around the wound. SEEK IMMEDIATE MEDICAL CARE IF:  You develop severe swelling around the wound.  Your pain suddenly increases and is severe.  You develop painful lumps near the wound or on skin that is anywhere on your body.  You have a red streak going away from your wound.  The wound is on your hand or foot and you cannot properly move a finger or toe.  The wound is on your hand or foot and you notice that your fingers or toes look pale or bluish.   This information is not intended to replace advice given to you by your health care provider. Make sure you discuss any questions you have with your health care provider.   Document Released: 03/05/2005 Document Revised: 07/20/2014 Document Reviewed: 03/01/2014 Elsevier Interactive Patient Education 2016 [employer]     IF you received an x-ray today, you will receive an invoice from [facility]. Please contact [facility] at [phone] with questions or concerns regarding your invoice.   IF you received labwork today, you will receive an invoice from [facility]. Please contact [facility] at [phone] with questions or concerns  regarding your invoice.   Our billing staff will not be able to assist you with questions regarding bills from these companies.  You will be contacted with the lab results as soon as they are available. The fastest way to get your results is to activate your My Chart account. Instructions are located on the last page of this paperwork. If you have not heard from us regarding the results in 2 weeks, please contact this office.     

## 2015-07-20 ENCOUNTER — Ambulatory Visit (INDEPENDENT_AMBULATORY_CARE_PROVIDER_SITE_OTHER): Payer: Worker's Compensation | Admitting: Physician Assistant

## 2015-07-20 VITALS — BP 122/72 | HR 81 | Temp 98.7°F | Resp 17 | Ht 64.5 in | Wt 200.0 lb

## 2015-07-20 DIAGNOSIS — S61412D Laceration without foreign body of left hand, subsequent encounter: Secondary | ICD-10-CM

## 2015-07-20 DIAGNOSIS — Y990 Civilian activity done for income or pay: Secondary | ICD-10-CM

## 2015-07-20 DIAGNOSIS — Z4802 Encounter for removal of sutures: Secondary | ICD-10-CM

## 2015-07-20 NOTE — Progress Notes (Signed)
MRN: [id number] DOB: Mar 16, 1986  Subjective:  Pt presents to clinic with an injury that occurred at work on 07/12/2015.  He is feeling much better - the area is a little itchy but otherwise he has had no problems.  Review of Systems  Constitutional: Negative for fever and chills.  Skin: Positive for wound.    Objective:  BP 122/72 mmHg  Pulse 81  Temp(Src) 98.7 F (37.1 C) (Oral)  Resp 17  Ht 5' 4.5" (1.638 m)  Wt 200 lb (90.719 kg)  BMI 33.81 kg/m2  SpO2 97%  Physical Exam  Constitutional: He is oriented to person, place, and time and well-developed, well-nourished, and in no distress.  HENT:  Head: Normocephalic and atraumatic.  Right Ear: External ear normal.  Left Ear: External ear normal.  Eyes: Conjunctivae are normal.  Neck: Normal range of motion.  Pulmonary/Chest: Effort normal.  Neurological: He is alert and oriented to person, place, and time. Gait normal.  Skin: Skin is warm and dry.  Well healed wound on his hand - the medial aspect of the wound slightly dehisced after sutures were removed but the wound seems healed deep.  Psychiatric: Mood, memory, affect and judgment normal.    Assessment and Plan :  Work related injury - Plan: Care order/instruction  Encounter for removal of sutures  Hand laceration, left, subsequent encounter   Wound care d/w pt.  No f/u needed.   Benny LennertSarah Djuna Frechette PA-C  [facility] 07/20/2015 10:24 AM

## 2015-07-20 NOTE — Patient Instructions (Signed)
     IF you received an x-ray today, you will receive an invoice from [facility]. Please contact [facility] at [phone] with questions or concerns regarding your invoice.   IF you received labwork today, you will receive an invoice from [facility]. Please contact [facility] at [phone] with questions or concerns regarding your invoice.   Our billing staff will not be able to assist you with questions regarding bills from these companies.  You will be contacted with the lab results as soon as they are available. The fastest way to get your results is to activate your My Chart account. Instructions are located on the last page of this paperwork. If you have not heard from us regarding the results in 2 weeks, please contact this office.      

## 2016-07-16 ENCOUNTER — Encounter (HOSPITAL_COMMUNITY): Payer: Self-pay | Admitting: Emergency Medicine

## 2016-07-16 ENCOUNTER — Emergency Department (HOSPITAL_COMMUNITY)
Admission: EM | Admit: 2016-07-16 | Discharge: 2016-07-16 | Disposition: A | Discharge status: Home / Self Care | Payer: BLUE CROSS/BLUE SHIELD | Attending: Emergency Medicine | Admitting: Emergency Medicine

## 2016-07-16 DIAGNOSIS — H6121 Impacted cerumen, right ear: Secondary | ICD-10-CM | POA: Insufficient documentation

## 2016-07-16 NOTE — ED Notes (Signed)
Administered  1/2 warm water/ 1/2 Peroxide to patients right middle ear to remove wax.

## 2016-07-16 NOTE — ED Triage Notes (Signed)
Patient c/o right ear pain since mid of last week. Patient states that he tried cleaning it out with peroxide and noticed a lot of wax and pus coming out. Patient reports that ear pain has increased.

## 2016-07-16 NOTE — ED Provider Notes (Signed)
[facility]-EMERGENCY DEPT Provider Note   CSN: [id number] Arrival date & time: 07/16/16  1817  By signing my name below, I, Manuel Glover, attest that this documentation has been prepared under the direction and in the presence of Nance Mccombs, PA-C. Electronically Signed: Deland Glover, ED Scribe. 07/16/16. 7:42 PM.    History   Chief Complaint Chief Complaint  Patient presents with  . Otalgia    The history is provided by the patient. No language interpreter was used.    HPI Comments: Manuel Glover is a 31 y.o. male who presents to the Emergency Department complaining of gradually worsening right ear pain with an onset of 4-5 days ago. Pt notes that the pain worsened from mild to moderate. Pt has complaints of HA, nausea, decreased hearing of the right ear, and ear driainage that was yellow and purulent. The pt tried applying peroxide to the ear without significant relief and notes that his drainage began following this. He has not recently used q-tips or other probes to the ear. 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 Pt denies sore throat, fever, and other associated symptoms.   Past Medical History:  Diagnosis Date  . GERD (gastroesophageal reflux disease)    Patient Active Problem List   Diagnosis Date Noted  . Blunt head trauma 11/11/2012  . Concussion 11/11/2012  . Scalp laceration 11/11/2012  . Facial laceration 11/11/2012  . GERD (gastroesophageal reflux disease) 11/11/2012    Past Surgical History:  Procedure Laterality Date  . EXTERNAL EAR SURGERY         Home Medications    Prior to Admission medications   Medication Sig Start Date End Date Taking? Authorizing Provider  acetaminophen (TYLENOL) 325 MG tablet Take 2 tablets (650 mg total) by mouth every 4 (four) hours as needed. Patient not taking: Reported on 06/12/2015 11/12/12   Ashok Norris, NP  azithromycin (ZITHROMAX Z-PAK)  250 MG tablet Take two tablets PO on day 1 and one tablet PO days 2-5 Patient not taking: Reported on 06/12/2015 03/16/15   Renne Crigler, PA-C  benzonatate (TESSALON) 100 MG capsule Take 1 capsule (100 mg total) by mouth every 8 (eight) hours. Patient not taking: Reported on 06/12/2015 03/16/15   Renne Crigler, PA-C  ibuprofen (ADVIL,MOTRIN) 800 MG tablet Take 1 tablet (800 mg total) by mouth 3 (three) times daily. Patient not taking: Reported on 06/12/2015 10/03/14   Teressa Lower, NP  naproxen (NAPROSYN) 500 MG tablet Take 1 tablet (500 mg total) by mouth 2 (two) times daily. Patient not taking: Reported on 06/12/2015 07/20/14   Harle Battiest, NP  omeprazole (PRILOSEC OTC) 20 MG tablet Take 20 mg by mouth daily. Reported on 07/20/2015    Historical Provider, MD  ondansetron (ZOFRAN ODT) 4 MG disintegrating tablet Take 1 tablet (4 mg total) by mouth every 8 (eight) hours as needed for nausea. Patient not taking: Reported on 07/12/2015 06/12/15   Mady Gemma, PA-C    Family History Family History  Problem Relation Age of Onset  . Cancer Maternal Uncle     accute lymphoma  . Cancer Maternal Grandmother     lymphoma    Social History Social History  Substance Use Topics  . Smoking status: Never Smoker  . Smokeless tobacco: Never Used  . Alcohol use No     Allergies   Penicillins   Review of Systems Review of Systems  Constitutional: Negative for fever.  HENT: Positive for ear discharge and ear pain. Negative for sore throat.   Gastrointestinal: Positive for nausea.  Neurological: Positive for headaches.   Physical Exam Updated Vital Signs BP (!) 119/91   Pulse (!) 103   Temp 98.1 F (36.7 C) (Oral)   Resp 15   SpO2 96%   Physical Exam  Constitutional: He is oriented to person, place, and time. He appears well-developed and well-nourished.  HENT:  Head: Normocephalic.  Mouth/Throat: Oropharynx is clear and moist.  Normal external ears bilaterally. Right ear  canal with cerumen impaction.  Eyes: EOM are normal.  Neck: Normal range of motion.  Pulmonary/Chest: Effort normal.  Abdominal: He exhibits no distension.  Musculoskeletal: Normal range of motion.  Neurological: He is alert and oriented to person, place, and time.  Psychiatric: He has a normal mood and affect.  Nursing note and vitals reviewed.  ED Treatments / Results  DIAGNOSTIC STUDIES: Oxygen Saturation is 96% on RA, adequate by my interpretation.   COORDINATION OF CARE: 7:30 PM-Discussed next steps with pt. Pt verbalized understanding and is agreeable with the plan.   Labs (all labs ordered are listed, but only abnormal results are displayed) Labs Reviewed - No data to display  EKG  EKG Interpretation None       Radiology No results found.  Procedures .Ear Cerumen Removal Date/Time: 07/16/2016 8:46 PM Performed by: Jaynie Crumble Authorized by: Jaynie Crumble   Consent:    Consent obtained:  Verbal   Consent given by:  Patient   Risks discussed:  Bleeding, incomplete removal and pain   Alternatives discussed:  No treatment Universal protocol:    Procedure explained and questions answered to patient or proxy's satisfaction: yes     Relevant documents present and verified: yes     Test results available and properly labeled: yes     Imaging studies available: yes     Required blood products, implants, devices and special equipment available: yes     Site/side marked: yes     Immediately prior to procedure a time out was called: yes     Patient identity confirmed:  Verbally with patient, arm band and provided demographic data Procedure details:    Location:  R ear   Procedure type: irrigation   Post-procedure details:    Inspection:  TM intact   Hearing quality:  Improved   Patient tolerance of procedure:  Tolerated well, no immediate complications   (including critical care time)  Medications Ordered in ED Medications - No data to  display   Initial Impression / Assessment and Plan / ED Course  I have reviewed the triage vital signs and the nursing notes.  Pertinent labs & imaging results that were available during my care of the patient were reviewed by me and considered in my medical decision making (see chart for details).     Patient with right ear pain and decreased hearing, patient with right ear cerumen impaction. I irrigated the ear with large cerumen out. Patient feels much better. He states his symptoms resolved. Home with over-the-counter cerumen removal kits. Follow-up as needed. On reevaluation TM is normal, intact. No signs of infection or perforation.  Vitals:   07/16/16 1832  BP: (!) 119/91  Pulse: (!) 103  Resp: 15  Temp: 98.1 F (36.7 C)  TempSrc: Oral  SpO2: 96%     Final Clinical Impressions(s) / ED Diagnoses   Final diagnoses:  Impacted cerumen of right ear    New Prescriptions New Prescriptions  No medications on file    I personally performed the services described in this documentation, which was scribed in my presence. The recorded information has been reviewed and is accurate.    Jaynie Crumble, PA-C 07/16/16 2115    Maia Plan, MD 07/17/16 1019

## 2016-07-16 NOTE — Discharge Instructions (Signed)
Continue with over the counter cerumen removal. Follow up with family doctor as needed

## 2018-02-06 ENCOUNTER — Ambulatory Visit (HOSPITAL_COMMUNITY)
Admission: EM | Admit: 2018-02-06 | Discharge: 2018-02-06 | Disposition: A | Discharge status: Home / Self Care | Payer: BLUE CROSS/BLUE SHIELD | Attending: Emergency Medicine | Admitting: Emergency Medicine

## 2018-02-06 ENCOUNTER — Encounter (HOSPITAL_COMMUNITY): Payer: Self-pay

## 2018-02-06 ENCOUNTER — Other Ambulatory Visit: Payer: Self-pay

## 2018-02-06 DIAGNOSIS — J40 Bronchitis, not specified as acute or chronic: Secondary | ICD-10-CM | POA: Diagnosis not present

## 2018-02-06 MED ORDER — IPRATROPIUM BROMIDE 0.06 % NA SOLN: 2.0000 | Freq: Four times a day (QID) | NASAL | 12 refills | Status: DC

## 2018-02-06 MED ORDER — BENZONATATE 100 MG PO CAPS: 200.0000 mg | ORAL_CAPSULE | Freq: Three times a day (TID) | ORAL | 0 refills | Status: DC | PRN

## 2018-02-06 MED ORDER — PREDNISONE 20 MG PO TABS: 20.0000 mg | ORAL_TABLET | Freq: Every day | ORAL | 0 refills | Status: AC

## 2018-02-06 MED ORDER — AZITHROMYCIN 250 MG PO TABS: ORAL_TABLET | ORAL | 0 refills | Status: AC

## 2018-02-06 NOTE — ED Provider Notes (Signed)
[facility]    CSN: [id number] Arrival date & time: 02/06/18  0850     History   Chief Complaint Chief Complaint  Patient presents with  . Cough  . Nasal Congestion    HPI Manuel Glover is a 32 y.o. male.   Manuel Glover presents with complaints of persistent cough. States 1 week ago started with itchy throat, cough and congestion. These have improved but cough has worsened over the past few days, especially at night. Causes left sided chest pressure which is worse with coughing. No fevers. No nausea, vomiting or diaphoresis. Cough is productive. No shortness of breath. Still some runny nose but has improved. No ear pain. Some headache. History of asthma as a child. Doesn't smoke. No gi/gu complaints. No rash. No known ill contacts. Has been trying dayquil, tylenol cold and other OTC medications which have not helped. Hx of gerd. No cardiac hx, no diabetes.     ROS per HPI.      Past Medical History:  Diagnosis Date  . GERD (gastroesophageal reflux disease)     Patient Active Problem List   Diagnosis Date Noted  . Blunt head trauma 11/11/2012  . Concussion 11/11/2012  . Scalp laceration 11/11/2012  . Facial laceration 11/11/2012  . GERD (gastroesophageal reflux disease) 11/11/2012    Past Surgical History:  Procedure Laterality Date  . EXTERNAL EAR SURGERY         Home Medications    Prior to Admission medications   Medication Sig Start Date End Date Taking? Authorizing Provider  azithromycin (ZITHROMAX) 250 MG tablet Take 2 tablets (500 mg total) by mouth daily for 1 day, THEN 1 tablet (250 mg total) daily for 4 days. 02/08/18 02/13/18  Georgetta HaberBurky, Damarco Keysor B, NP  benzonatate (TESSALON) 100 MG capsule Take 2 capsules (200 mg total) by mouth 3 (three) times daily as needed for cough. 02/06/18   Linus MakoBurky, Xaiver Roskelley B, NP  ipratropium (ATROVENT) 0.06 % nasal spray Place 2 sprays into both nostrils 4 (four) times daily. 02/06/18   Georgetta HaberBurky, Aisley Whan B, NP  omeprazole  (PRILOSEC OTC) 20 MG tablet Take 20 mg by mouth daily. Reported on 07/20/2015    [provider]  ondansetron (ZOFRAN ODT) 4 MG disintegrating tablet Take 1 tablet (4 mg total) by mouth every 8 (eight) hours as needed for nausea. Patient not taking: Reported on 07/12/2015 06/12/15   Mady GemmaWestfall, Elizabeth C, PA-C  predniSONE (DELTASONE) 20 MG tablet Take 1 tablet (20 mg total) by mouth daily with breakfast for 5 days. 02/06/18 02/11/18  Georgetta HaberBurky, Lynlee Stratton B, NP    Family History Family History  Problem Relation Age of Onset  . Cancer Maternal Uncle        accute lymphoma  . Cancer Maternal Grandmother        lymphoma    Social History Social History   Tobacco Use  . Smoking status: Never Smoker  . Smokeless tobacco: Never Used  Substance Use Topics  . Alcohol use: No  . Drug use: No     Allergies   Penicillins   Review of Systems Review of Systems   Physical Exam Triage Vital Signs ED Triage Vitals  Enc Vitals Group     BP 02/06/18 0946 (!) 137/92     Pulse Rate 02/06/18 0946 86     Resp 02/06/18 0946 18     Temp 02/06/18 0946 98.6 F (37 C)     Temp Source 02/06/18 0946 Oral     SpO2 02/06/18  0946 98 %     Weight 02/06/18 0944 205 lb (93 kg)     Height --      Head Circumference --      Peak Flow --      Pain Score 02/06/18 0943 2     Pain Loc --      Pain Edu? --      Excl. in GC? --    No data found.  Updated Vital Signs BP (!) 137/92 (BP Location: Left Arm)   Pulse 86   Temp 98.6 F (37 C) (Oral)   Resp 18   Wt 205 lb (93 kg)   SpO2 98%   BMI 34.64 kg/m   Visual Acuity Right Eye Distance:   Left Eye Distance:   Bilateral Distance:    Right Eye Near:   Left Eye Near:    Bilateral Near:     Physical Exam  Constitutional: He is oriented to person, place, and time. He appears well-developed and well-nourished.  HENT:  Head: Normocephalic and atraumatic.  Right Ear: Tympanic membrane, external ear and ear canal normal.  Left Ear: Tympanic  membrane, external ear and ear canal normal.  Nose: Rhinorrhea present. Right sinus exhibits no maxillary sinus tenderness and no frontal sinus tenderness. Left sinus exhibits no maxillary sinus tenderness and no frontal sinus tenderness.  Mouth/Throat: Uvula is midline, oropharynx is clear and moist and mucous membranes are normal.  Eyes: Pupils are equal, round, and reactive to light. Conjunctivae are normal.  Neck: Normal range of motion.  Cardiovascular: Normal rate and regular rhythm.  Pulmonary/Chest: Effort normal and breath sounds normal.  No cough throughout exam  Lymphadenopathy:    He has no cervical adenopathy.  Neurological: He is alert and oriented to person, place, and time.  Skin: Skin is warm and dry.  Vitals reviewed.    UC Treatments / Results  Labs (all labs ordered are listed, but only abnormal results are displayed) Labs Reviewed - No data to display  EKG None  Radiology No results found.  Procedures Procedures (including critical care time)  Medications Ordered in UC Medications - No data to display  Initial Impression / Assessment and Plan / UC Course  I have reviewed the triage vital signs and the nursing notes.  Pertinent labs & imaging results that were available during my care of the patient were reviewed by me and considered in my medical decision making (see chart for details).     One week of URI with persistent cough. Non toxic, afebrile. No increased work of breathing. Lungs clear. History and physical consistent with viral illness.  Supportive cares recommended. Antibiotics can be started if still no improvement in symptoms in the next 2-3 days, discussed with patient. Return precautions provided. If symptoms worsen or do not improve in the next week to return to be seen or to follow up with PCP.  Patient verbalized understanding and agreeable to plan.   Final Clinical Impressions(s) / UC Diagnoses   Final diagnoses:  Bronchitis      Discharge Instructions     Push fluids to ensure adequate hydration and keep secretions thin.  Tylenol and/or ibuprofen as needed for pain or fevers.  Use of Tessalon as needed for cough. Atrovent nasal spray may help with post nasal drip and congestion.  Prednisone to further help with cough and chest discomfort.  If no improvement or worsening of symptoms in the next few days with trial of these medications may start antibiotics. If  improving at all no need to fill antibiotics as they will likely be unhelpful.  If symptoms worsen or do not improve in the next week to return to be seen or to follow up with your PCP.     ED Prescriptions    Medication Sig Dispense Auth. Provider   ipratropium (ATROVENT) 0.06 % nasal spray Place 2 sprays into both nostrils 4 (four) times daily. 15 mL Linus Mako B, NP   predniSONE (DELTASONE) 20 MG tablet Take 1 tablet (20 mg total) by mouth daily with breakfast for 5 days. 5 tablet Linus Mako B, NP   benzonatate (TESSALON) 100 MG capsule Take 2 capsules (200 mg total) by mouth 3 (three) times daily as needed for cough. 21 capsule Linus Mako B, NP   azithromycin (ZITHROMAX) 250 MG tablet Take 2 tablets (500 mg total) by mouth daily for 1 day, THEN 1 tablet (250 mg total) daily for 4 days. 6 tablet Georgetta Haber, NP     Controlled Substance Prescriptions  Controlled Substance Registry consulted? Not Applicable   Georgetta Haber, NP 02/06/18 1015

## 2018-02-06 NOTE — ED Triage Notes (Signed)
Pt cc cough and congestion.. The coughing make his chest have a discomfort. X 1 week.

## 2018-02-06 NOTE — Discharge Instructions (Signed)
Push fluids to ensure adequate hydration and keep secretions thin.  Tylenol and/or ibuprofen as needed for pain or fevers.  Use of Tessalon as needed for cough. Atrovent nasal spray may help with post nasal drip and congestion.  Prednisone to further help with cough and chest discomfort.  If no improvement or worsening of symptoms in the next few days with trial of these medications may start antibiotics. If improving at all no need to fill antibiotics as they will likely be unhelpful.  If symptoms worsen or do not improve in the next week to return to be seen or to follow up with your PCP.

## 2018-05-28 ENCOUNTER — Ambulatory Visit: Payer: Self-pay | Admitting: Family Medicine

## 2018-08-25 ENCOUNTER — Ambulatory Visit
Admission: EM | Admit: 2018-08-25 | Discharge: 2018-08-25 | Disposition: A | Discharge status: Home / Self Care | Payer: Self-pay

## 2018-08-25 ENCOUNTER — Encounter: Payer: Self-pay | Admitting: Emergency Medicine

## 2018-08-25 ENCOUNTER — Emergency Department (HOSPITAL_COMMUNITY)
Admission: EM | Admit: 2018-08-25 | Discharge: 2018-08-25 | Disposition: A | Discharge status: Home / Self Care | Payer: Self-pay | Attending: Emergency Medicine | Admitting: Emergency Medicine

## 2018-08-25 ENCOUNTER — Encounter (HOSPITAL_COMMUNITY): Payer: Self-pay | Admitting: Emergency Medicine

## 2018-08-25 ENCOUNTER — Emergency Department (HOSPITAL_COMMUNITY): Payer: Self-pay

## 2018-08-25 ENCOUNTER — Other Ambulatory Visit: Payer: Self-pay

## 2018-08-25 DIAGNOSIS — R079 Chest pain, unspecified: Secondary | ICD-10-CM | POA: Insufficient documentation

## 2018-08-25 DIAGNOSIS — R000 Tachycardia, unspecified: Secondary | ICD-10-CM

## 2018-08-25 DIAGNOSIS — Z5321 Procedure and treatment not carried out due to patient leaving prior to being seen by health care provider: Secondary | ICD-10-CM | POA: Insufficient documentation

## 2018-08-25 LAB — BASIC METABOLIC PANEL
Anion gap: 14 (ref 5–15)
BUN: 11 mg/dL (ref 6–20)
CO2: 21 mmol/L — ABNORMAL LOW (ref 22–32)
Calcium: 9.6 mg/dL (ref 8.9–10.3)
Chloride: 103 mmol/L (ref 98–111)
Creatinine, Ser: 0.97 mg/dL (ref 0.61–1.24)
GFR calc Af Amer: >60 mL/min (ref >60)
GFR calc non Af Amer: >60 mL/min (ref >60)
Glucose, Bld: 94 mg/dL (ref 70–99)
Potassium: 3.8 mmol/L (ref 3.5–5.1)
Sodium: 138 mmol/L (ref 135–145)

## 2018-08-25 LAB — CBC
HCT: 48 % (ref 39.0–52.0)
Hemoglobin: 16.1 g/dL (ref 13.0–17.0)
MCH: 28.9 pg (ref 26.0–34.0)
MCHC: 33.5 g/dL (ref 30.0–36.0)
MCV: 86.2 fL (ref 80.0–100.0)
Platelets: 322 10*3/uL (ref 150–400)
RBC: 5.57 MIL/uL (ref 4.22–5.81)
RDW: 12.8 % (ref 11.5–15.5)
WBC: 19.1 10*3/uL — ABNORMAL HIGH (ref 4.0–10.5)
nRBC: 0 % (ref 0.0–0.2)

## 2018-08-25 LAB — TROPONIN I: Troponin I: <0.03 ng/mL (ref <0.03)

## 2018-08-25 MED ORDER — SODIUM CHLORIDE 0.9% FLUSH: 3.0000 mL | Freq: Once | INTRAVENOUS | Status: DC

## 2018-08-25 NOTE — ED Triage Notes (Signed)
Pt has been to Urgent Care this am  Sent here for evaluation of tachy

## 2018-08-25 NOTE — ED Notes (Signed)
Pt not in waiting area 

## 2018-08-25 NOTE — Discharge Instructions (Addendum)
Recommending further evaluation and management in the ED.  Cannot rule out cardiac cause or blood clot in office.  Patient aware and in agreement with this plan.  Will go by private vehicle to [facility] ED.

## 2018-08-25 NOTE — ED Provider Notes (Signed)
[facility]   [id number] 08/25/18 Arrival Time: 1252   CC: CHEST pain  SUBJECTIVE:  RISHARD DELANGE is a 33 y.o. male hx significant for GERD, who presents with complaint of gradual chest pain and "knot on chest," now improved, that began 3 days ago.  Does admit to heavy lifting for work, but denies a specific injury. Localizes chest discomfort to center of chest and radiates towards left chest and around to his back.  Describes as stable, that is intermittent, sharp and pressure in character.  Rates pain as 6/10.   Has tried advil without relief.  Symptoms made worse with twisting motions about the torso.  Reports hx of torn muscle lower chest. Complains of associated intermittent tingling in left arm, currently asymptomatic. Denies fever, chills, lightheadedness, dizziness, palpitations, tachycardia, cough, SOB, nausea, vomiting, abdominal pain, changes in bowel or bladder habits, diaphoresis, peripheral edema, or anxiety.    Denies SOB, calf pain or swelling, recent long travel, recent surgery, malignancy, tobacco use, hormone use, or previous blood clot  Paternal great grandmother had heart attack, and paternal grandmother had heart attack.  Denies parents, or siblings with MI or stroke.   Previous cardiac testing: electrocardiogram (ECG). Normal  ROS: As per HPI. Past Medical History:  Diagnosis Date  . GERD (gastroesophageal reflux disease)    Past Surgical History:  Procedure Laterality Date  . EXTERNAL EAR SURGERY     Allergies  Allergen Reactions  . Penicillins Hives and Swelling    .Marland KitchenHas patient had a PCN reaction causing immediate rash, facial/tongue/throat swelling, SOB or lightheadedness with hypotension: No Has patient had a PCN reaction causing severe rash involving mucus membranes or skin necrosis: No Has patient had a PCN reaction that required hospitalization Yes Has patient had a PCN reaction occurring within the last 10 years: No If all of the above  answers are "NO", then may proceed with Cephalosporin use.    No current facility-administered medications on file prior to encounter.    Current Outpatient Medications on File Prior to Encounter  Medication Sig Dispense Refill  . ipratropium (ATROVENT) 0.06 % nasal spray Place 2 sprays into both nostrils 4 (four) times daily. 15 mL 12  . omeprazole (PRILOSEC OTC) 20 MG tablet Take 20 mg by mouth daily. Reported on 07/20/2015     Social History   Socioeconomic History  . Marital status: Married    Spouse name: Not on file  . Number of children: Not on file  . Years of education: Not on file  . Highest education level: Not on file  Occupational History  . Not on file  Social Needs  . Financial resource strain: Not on file  . Food insecurity:    Worry: Not on file    Inability: Not on file  . Transportation needs:    Medical: Not on file    Non-medical: Not on file  Tobacco Use  . Smoking status: Never Smoker  . Smokeless tobacco: Never Used  Substance and Sexual Activity  . Alcohol use: No  . Drug use: No  . Sexual activity: Not on file  Lifestyle  . Physical activity:    Days per week: Not on file    Minutes per session: Not on file  . Stress: Not on file  Relationships  . Social connections:    Talks on phone: Not on file    Gets together: Not on file    Attends religious service: Not on file    Active member  of club or organization: Not on file    Attends meetings of clubs or organizations: Not on file    Relationship status: Not on file  . Intimate partner violence:    Fear of current or ex partner: Not on file    Emotionally abused: Not on file    Physically abused: Not on file    Forced sexual activity: Not on file  Other Topics Concern  . Not on file  Social History Narrative  . Not on file   Family History  Problem Relation Age of Onset  . Cancer Maternal Uncle        accute lymphoma  . Cancer Maternal Grandmother        lymphoma     OBJECTIVE:   Vitals:   08/25/18 1302  BP: (!) 135/96  Pulse: (!) 115  Resp: 18  Temp: 99.2 F (37.3 C)  TempSrc: Oral  SpO2: 97%    General appearance: alert; no distress Eyes: PERRLA; EOMI; conjunctiva normal HENT: normocephalic; atraumatic; oropharynx clear Neck: supple; no carotid bruits Lungs: clear to auscultation bilaterally without adventitious breath sounds Heart: tachycardic.  Radial pulses 2+.  Posterior tibialis pulses 2+.  Cap refill < 2 seconds Chest Wall: No heaves, lifts or thrills; mildly TTP over distal sternum; NTTP over left paravertebral muscles or left chest; NTTP with AP and lateral compression of chest Extremities: no cyanosis or edema; no calf tenderness or erythema; symmetrical with no gross deformities; FROM about bilateral shoulders; Strength and sensation intact about the upper extremities Skin: warm and dry Psychological: alert and cooperative; normal mood and affect  ASSESSMENT & PLAN:  1. Left-sided chest pain   2. Tachycardia    Recommending further evaluation and management in the ED.  Cannot rule out cardiac cause or blood clot in office.  Patient aware and in agreement with this plan.  Will go by private vehicle to [facility] ED.     Rennis HardingWurst, Srihitha Tagliaferri, PA-C 08/25/18 1418

## 2018-08-25 NOTE — ED Triage Notes (Signed)
Intermittent CP since Friday   CP when he moves to the left   No hx of heart dx   No PCP

## 2018-08-25 NOTE — ED Triage Notes (Signed)
Pt here for chest pain worse with palpation; pt thinks he may have pulled muscle

## 2020-01-29 IMAGING — DX CHEST - 2 VIEW
2 series · 2 of 2 positions shown · non-contrast
Comparison: Chest x-ray dated March 16, 2015.

CLINICAL DATA: Chest pain and tachycardia.

EXAM:
CHEST - 2 VIEW

[chest pa]
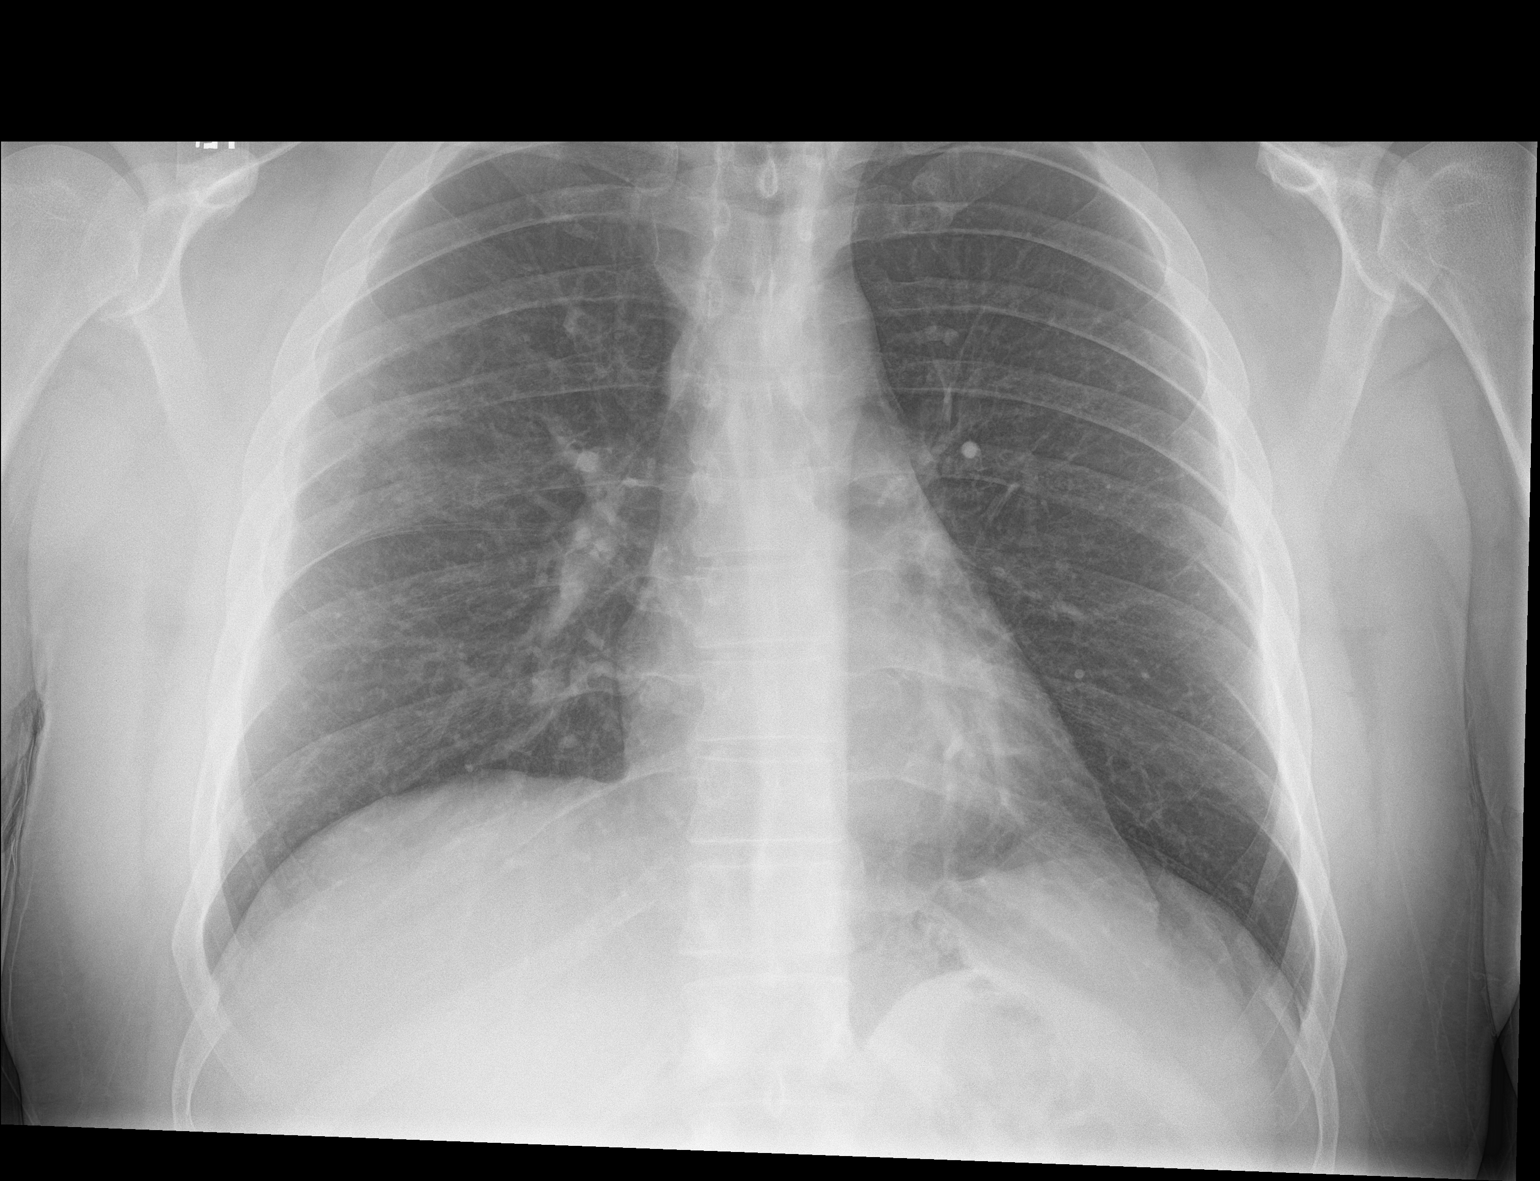

[chest lat]
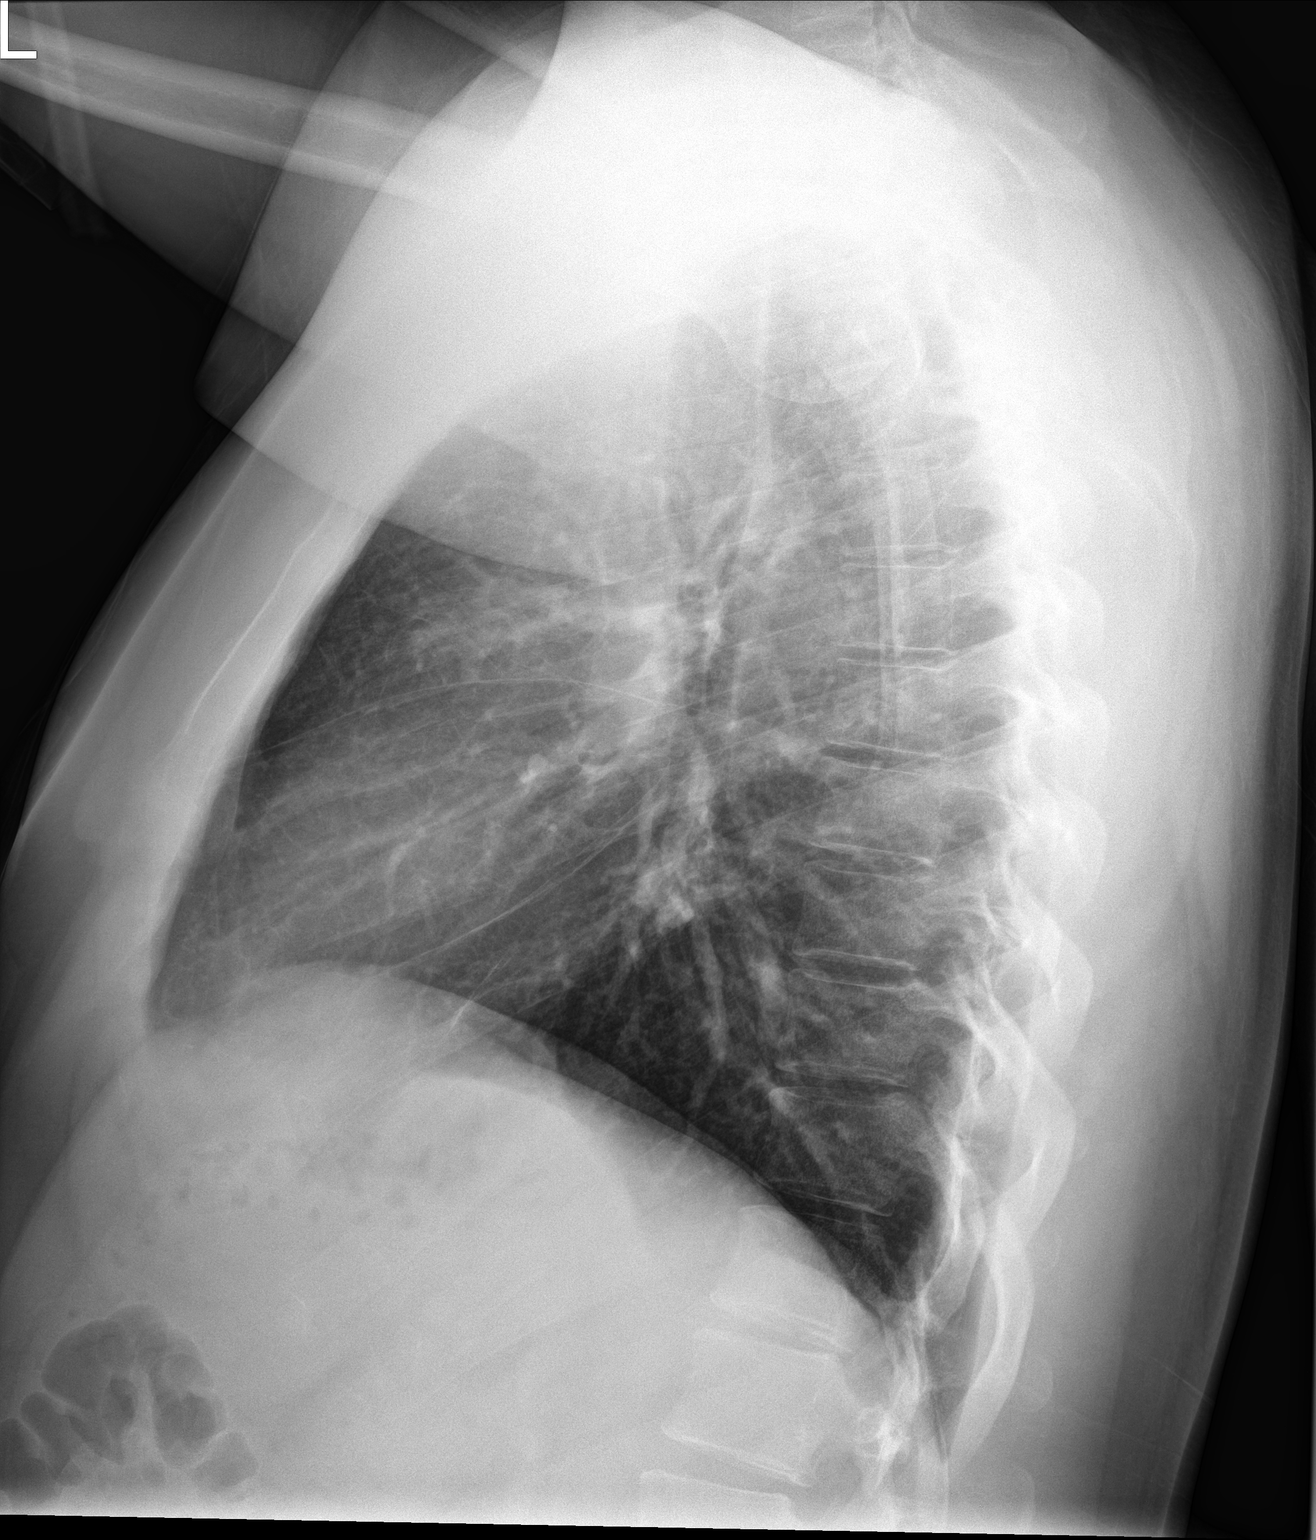

[2 of 2 positions shown; findings below may reference images not displayed]

FINDINGS: The heart size and mediastinal contours are within normal limits.
Normal pulmonary vascularity. New small opacity in the right upper
lobe. No pleural effusion pneumothorax. No acute osseous
abnormality.
IMPRESSION: 1. New small opacity in the right upper lobe, concerning for
bronchopneumonia.

## 2021-12-14 ENCOUNTER — Ambulatory Visit: Payer: Self-pay | Admitting: Family Medicine

## 2021-12-15 ENCOUNTER — Ambulatory Visit (INDEPENDENT_AMBULATORY_CARE_PROVIDER_SITE_OTHER): Payer: No Typology Code available for payment source | Admitting: Family Medicine

## 2021-12-15 ENCOUNTER — Encounter: Payer: Self-pay | Admitting: Family Medicine

## 2021-12-15 VITALS — BP 133/89 | HR 82 | Ht 64.0 in | Wt 221.0 lb

## 2021-12-15 DIAGNOSIS — G4733 Obstructive sleep apnea (adult) (pediatric): Secondary | ICD-10-CM | POA: Diagnosis not present

## 2021-12-15 DIAGNOSIS — R351 Nocturia: Secondary | ICD-10-CM | POA: Diagnosis not present

## 2021-12-15 DIAGNOSIS — E669 Obesity, unspecified: Secondary | ICD-10-CM | POA: Insufficient documentation

## 2021-12-15 DIAGNOSIS — Z13220 Encounter for screening for lipoid disorders: Secondary | ICD-10-CM | POA: Diagnosis not present

## 2021-12-15 DIAGNOSIS — Z130 Encounter for screening for diseases of the blood and blood-forming organs and certain disorders involving the immune mechanism: Secondary | ICD-10-CM

## 2021-12-15 LAB — POCT URINALYSIS DIP (CLINITEK)
Bilirubin, UA: NEGATIVE
Blood, UA: NEGATIVE
Glucose, UA: NEGATIVE mg/dL
Ketones, POC UA: NEGATIVE mg/dL
Leukocytes, UA: NEGATIVE
Nitrite, UA: NEGATIVE
POC PROTEIN,UA: NEGATIVE
Spec Grav, UA: 1.02 (ref 1.010–1.025)
Urobilinogen, UA: 0.2 E.U./dL
pH, UA: 5 (ref 5.0–8.0)

## 2021-12-15 NOTE — Assessment & Plan Note (Signed)
Urinalysis was clear.  Obtaining laboratory work-up.  Advised to watch his fluid intake.

## 2021-12-15 NOTE — Patient Instructions (Signed)
Labs at your convenience.  Referral placed regarding sleep medicine.  Follow up annually or sooner if needed.  Take care  Dr. Lacinda Axon

## 2021-12-15 NOTE — Assessment & Plan Note (Signed)
Witnessed apnea by his spouse.  Referring to neurology for consultation.  Needs a sleep study.

## 2021-12-15 NOTE — Progress Notes (Signed)
Subjective:  Patient ID: Manuel Glover, male    DOB: August 21, 1985  Age: 36 y.o. MRN: [id number]  CC: Chief Complaint  Patient presents with   New Patient (Initial Visit)    Establish care, sleep apnea concerns, frequent urination at night.    HPI:  36 year old male with GERD and obesity presents to establish care.  Patient has a few concerns today.  Patient states that he is concerned that he has sleep apnea.  He states that his wife has witnessed apnea when he sleeps.  He is obese with a BMI of 37.9.  Patient also reports that he has had recent nocturia.  He states that it has been going on for greater than 1 month.  At times he has to get up 3-4 times at night.  He states that he does not have significant symptoms during the day.  No abdominal pain.  No other associated symptoms.  No other complaints or concerns at this time.  Social Hx   Social History   Socioeconomic History   Marital status: Married    Spouse name: Not on file   Number of children: Not on file   Years of education: Not on file   Highest education level: Not on file  Occupational History   Not on file  Tobacco Use   Smoking status: Never   Smokeless tobacco: Never  Vaping Use   Vaping Use: Never used  Substance and Sexual Activity   Alcohol use: No   Drug use: No   Sexual activity: Not on file  Other Topics Concern   Not on file  Social History Narrative   Not on file   Social Determinants of Health   Financial Resource Strain: Not on file  Food Insecurity: Not on file  Transportation Needs: Not on file  Physical Activity: Not on file  Stress: Not on file  Social Connections: Not on file    Review of Systems Per HPI  Objective:  BP 133/89 (BP Location: Right Arm, Patient Position: Sitting, Cuff Size: Large)   Pulse 82   Ht 5' 4"  (1.626 m)   Wt 221 lb (100.2 kg)   SpO2 98%   BMI 37.93 kg/m      12/15/2021    2:00 PM 08/25/2018    2:02 PM 08/25/2018    2:00 PM  BP/Weight   Systolic BP 825  053  Diastolic BP 89  94  Wt. (Lbs) 221 210   BMI 37.93 kg/m2 34.95 kg/m2     Physical Exam Vitals and nursing note reviewed.  Constitutional:      General: He is not in acute distress.    Appearance: Normal appearance. He is not ill-appearing.  HENT:     Head: Normocephalic and atraumatic.     Mouth/Throat:     Pharynx: Oropharynx is clear.  Eyes:     General:        Right eye: No discharge.        Left eye: No discharge.     Conjunctiva/sclera: Conjunctivae normal.  Cardiovascular:     Rate and Rhythm: Normal rate and regular rhythm.  Pulmonary:     Effort: Pulmonary effort is normal.     Breath sounds: Normal breath sounds. No wheezing, rhonchi or rales.  Abdominal:     General: There is no distension.     Palpations: Abdomen is soft.     Tenderness: There is no abdominal tenderness.  Neurological:     Mental Status: He  is alert.  Psychiatric:        Mood and Affect: Mood normal.        Behavior: Behavior normal.     Lab Results  Component Value Date   WBC 19.1 (H) 08/25/2018   HGB 16.1 08/25/2018   HCT 48.0 08/25/2018   PLT 322 08/25/2018   GLUCOSE 94 08/25/2018   ALT 29 06/12/2015   AST 28 06/12/2015   NA 138 08/25/2018   K 3.8 08/25/2018   CL 103 08/25/2018   CREATININE 0.97 08/25/2018   BUN 11 08/25/2018   CO2 21 (L) 08/25/2018     Assessment & Plan:   Problem List Items Addressed This Visit       Respiratory   OSA (obstructive sleep apnea) - Primary    Witnessed apnea by his spouse.  Referring to neurology for consultation.  Needs a sleep study.      Relevant Orders   Ambulatory referral to Neurology     Other   Nocturia    Urinalysis was clear.  Obtaining laboratory work-up.  Advised to watch his fluid intake.      Relevant Orders   CMP14+EGFR   POCT URINALYSIS DIP (CLINITEK) (Completed)   Obesity (BMI 30-39.9)   Relevant Orders   CMP14+EGFR   Other Visit Diagnoses     Screening, lipid       Relevant Orders    Lipid panel   Screening for deficiency anemia       Relevant Orders   CBC      Follow-up:  Return in about 1 year (around 12/16/2022).  [place]

## 2021-12-16 LAB — LIPID PANEL
Chol/HDL Ratio: 5.3 ratio — ABNORMAL HIGH (ref 0.0–5.0)
Cholesterol, Total: 226 mg/dL — ABNORMAL HIGH (ref 100–199)
HDL: 43 mg/dL (ref >39)
LDL Chol Calc (NIH): 130 mg/dL — ABNORMAL HIGH (ref 0–99)
Triglycerides: 295 mg/dL — ABNORMAL HIGH (ref 0–149)
VLDL Cholesterol Cal: 53 mg/dL — ABNORMAL HIGH (ref 5–40)

## 2021-12-16 LAB — CBC
Hematocrit: 45.7 % (ref 37.5–51.0)
Hemoglobin: 15.5 g/dL (ref 13.0–17.7)
MCH: 29.1 pg (ref 26.6–33.0)
MCHC: 33.9 g/dL (ref 31.5–35.7)
MCV: 86 fL (ref 79–97)
Platelets: 314 10*3/uL (ref 150–450)
RBC: 5.33 x10E6/uL (ref 4.14–5.80)
RDW: 14 % (ref 11.6–15.4)
WBC: 9.4 10*3/uL (ref 3.4–10.8)

## 2021-12-16 LAB — CMP14+EGFR
ALT: 20 IU/L (ref 0–44)
AST: 17 IU/L (ref 0–40)
Albumin/Globulin Ratio: 1.8 (ref 1.2–2.2)
Albumin: 4.8 g/dL (ref 4.1–5.1)
Alkaline Phosphatase: 71 IU/L (ref 44–121)
BUN/Creatinine Ratio: 10 (ref 9–20)
BUN: 10 mg/dL (ref 6–20)
Bilirubin Total: <0.2 mg/dL (ref 0.0–1.2)
CO2: 23 mmol/L (ref 20–29)
Calcium: 9.1 mg/dL (ref 8.7–10.2)
Chloride: 99 mmol/L (ref 96–106)
Creatinine, Ser: 1.04 mg/dL (ref 0.76–1.27)
Globulin, Total: 2.6 g/dL (ref 1.5–4.5)
Glucose: 83 mg/dL (ref 70–99)
Potassium: 4.1 mmol/L (ref 3.5–5.2)
Sodium: 138 mmol/L (ref 134–144)
Total Protein: 7.4 g/dL (ref 6.0–8.5)
eGFR: 96 mL/min/{1.73_m2} (ref >59)

## 2022-01-22 ENCOUNTER — Encounter: Payer: Self-pay | Admitting: Neurology

## 2022-01-22 ENCOUNTER — Ambulatory Visit (INDEPENDENT_AMBULATORY_CARE_PROVIDER_SITE_OTHER): Payer: No Typology Code available for payment source | Admitting: Neurology

## 2022-01-22 VITALS — BP 127/80 | HR 80 | Ht 64.0 in | Wt 224.0 lb

## 2022-01-22 DIAGNOSIS — R351 Nocturia: Secondary | ICD-10-CM

## 2022-01-22 DIAGNOSIS — R0681 Apnea, not elsewhere classified: Secondary | ICD-10-CM

## 2022-01-22 DIAGNOSIS — R0683 Snoring: Secondary | ICD-10-CM | POA: Diagnosis not present

## 2022-01-22 DIAGNOSIS — G4719 Other hypersomnia: Secondary | ICD-10-CM

## 2022-01-22 DIAGNOSIS — G4730 Sleep apnea, unspecified: Secondary | ICD-10-CM

## 2022-01-22 DIAGNOSIS — E669 Obesity, unspecified: Secondary | ICD-10-CM

## 2022-01-22 DIAGNOSIS — Z820 Family history of epilepsy and other diseases of the nervous system: Secondary | ICD-10-CM

## 2022-01-22 NOTE — Patient Instructions (Signed)

## 2022-01-22 NOTE — Progress Notes (Signed)
Subjective:    Patient ID: Manuel Glover is a 36 y.o. male.  HPI    Star Age, MD, PhD [facility] [address], [address] [place], [place] [address]  Dear Dr. Lacinda Axon,  I saw your patient, Manuel Glover, upon your kind request in my sleep clinic to confirm particular, concern for underlying obstructive sleep apnea.  The patient is unaccompanied today.  As you know, Mr. Manuel Glover is a 36 year old male with an underlying medical history of reflux disease, and obesity, who reports snoring and excessive daytime somnolence as well as witnessed apneas and nocturia.  I reviewed your office note from 12/15/2021.  His Epworth sleepiness score is 16 out of 24, fatigue severity score is 61 out of 63.  He lives with his wife, they have 1 child, he works at a [employer]rap[employer], occupational [employer].  Bedtime is generally around 10 and rise time around 6:30 AM.  He has nocturia about 2-3 times per average night, denies recurrent morning or nocturnal headaches, no recent reflux symptoms at night.  He is a non-smoker and drinks alcohol once or twice a week, he drinks 1 can of soda per day on average.  They have 3 dogs in the household.  He has a TV in his bedroom but does not have to have it on at night.  His father has sleep apnea and is on a CPAP machine.  Patient's weight has been fairly stable in the past couple of years.  His Past Medical History Is Significant For: Past Medical History:  Diagnosis Date   GERD (gastroesophageal reflux disease)     His Past Surgical History Is Significant For: Past Surgical History:  Procedure Laterality Date   EXTERNAL EAR SURGERY      His Family History Is Significant For: Family History  Problem Relation Age of Onset   Cancer Maternal Uncle        accute lymphoma   Cancer Maternal Grandmother        lymphoma    His Social History Is Significant For: Social History   Socioeconomic History   Marital status: Married    Spouse  name: Not on file   Number of children: Not on file   Years of education: Not on file   Highest education level: Not on file  Occupational History   Not on file  Tobacco Use   Smoking status: Never   Smokeless tobacco: Never  Vaping Use   Vaping Use: Never used  Substance and Sexual Activity   Alcohol use: Yes    Comment: 1-2 weekly   Drug use: No   Sexual activity: Yes    Partners: Female  Other Topics Concern   Not on file  Social History Narrative   Caffiene 1 serving daily   Lives with wife, and one child   Works for [employer]   Social Determinants of Radio broadcast assistant Strain: Not on file  Food Insecurity: Not on file  Transportation Needs: Not on file  Physical Activity: Not on file  Stress: Not on file  Social Connections: Not on file    His Allergies Are:  Allergies  Allergen Reactions   Penicillins Hives and Swelling    .Marland KitchenHas patient had a PCN reaction causing immediate rash, facial/tongue/throat swelling, SOB or lightheadedness with hypotension: No Has patient had a PCN reaction causing severe rash involving mucus membranes or skin necrosis: No Has patient had a PCN reaction that required hospitalization  Yes Has patient had a PCN reaction occurring within the last 10 years: No If all of the above answers are "NO", then may proceed with Cephalosporin use.   :   His Current Medications Are:  Outpatient Encounter Medications as of 01/22/2022  Medication Sig   omeprazole (PRILOSEC OTC) 20 MG tablet Take 20 mg by mouth daily. Reported on 07/20/2015   [DISCONTINUED] ipratropium (ATROVENT) 0.06 % nasal spray Place 2 sprays into both nostrils 4 (four) times daily. (Patient not taking: Reported on 12/15/2021)   No facility-administered encounter medications on file as of 01/22/2022.  :   Review of Systems:  Out of a complete 14 point review of systems, all are reviewed and negative with the exception of these symptoms as listed  below:  Review of Systems  Neurological:        Witness Apnea, fatigue, daytime sleepiness, snoring.  No sleep study.  FSS 61, ESS 16.   Questions if nocturia is sx of OSA.  Has had negative w/u per pcp    Objective:  Neurological Exam  Physical Exam Physical Examination:   Vitals:   01/22/22 0844  BP: 127/80  Pulse: 80    General Examination: The patient is a very pleasant 36 y.o. male in no acute distress. He appears well-developed and well-nourished and well groomed.   HEENT: Normocephalic, atraumatic, pupils are equal, round and reactive to light, extraocular tracking is good without limitation to gaze excursion or nystagmus noted. Hearing is grossly intact. Face is symmetric with normal facial animation. Speech is clear with no dysarthria noted. There is no hypophonia. There is no lip, neck/head, jaw or voice tremor. Neck is supple with full range of passive and active motion. There are no carotid bruits on auscultation. Oropharynx exam reveals: mild mouth dryness, adequate dental hygiene and moderate airway crowding, due to thicker soft palate and small airway entry, Mallampati class III, tonsils absent.  Minimal overbite.  Tongue protrudes centrally and palate elevates symmetrically, neck circumference of 19 inches.  Chest: Clear to auscultation without wheezing, rhonchi or crackles noted.  Heart: S1+S2+0, regular and normal without murmurs, rubs or gallops noted.   Abdomen: Soft, non-tender and non-distended.  Extremities: There is no pitting edema in the distal lower extremities bilaterally.   Skin: Warm and dry without trophic changes noted.   Musculoskeletal: exam reveals no obvious joint deformities.   Neurologically:  Mental status: The patient is awake, alert and oriented in all 4 spheres. His immediate and remote memory, attention, language skills and fund of knowledge are appropriate. There is no evidence of aphasia, agnosia, apraxia or anomia. Speech is clear with  normal prosody and enunciation. Thought process is linear. Mood is normal and affect is normal.  Cranial nerves II - XII are as described above under HEENT exam.  Motor exam: Normal bulk, strength and tone is noted. There is no obvious action or resting tremor.  Fine motor skills and coordination: grossly intact.  Cerebellar testing: No dysmetria or intention tremor. There is no truncal or gait ataxia.  Sensory exam: intact to light touch in the upper and lower extremities.  Gait, station and balance: He stands easily. No veering to one side is noted. No leaning to one side is noted. Posture is age-appropriate and stance is narrow based. Gait shows normal stride length and normal pace. No problems turning are noted.   Assessment an Plan:  In summary, JEFFRY VOGELSANG is a very pleasant 36 y.o.-year old male with an underlying medical  history of reflux disease, and obesity, whose history and physical exam are concerning for sleep disordered breathing, supporting a current working diagnosis of unspecified sleep apnea, with the main differential diagnoses of obstructive sleep apnea (OSA) versus upper airway resistance syndrome (UARS) versus central sleep apnea (CSA), or mixed sleep apnea. A laboratory attended sleep study is considered gold standard for evaluation of sleep disordered breathing and is recommended at this time and clinically justified.   I had a long chat with the patient about my findings and the diagnosis of sleep apnea, particularly OSA, its prognosis and treatment options. We talked about medical/conservative treatments, surgical interventions and non-pharmacological approaches for symptom control. I explained, in particular, the risks and ramifications of untreated moderate to severe OSA, especially with respect to developing cardiovascular disease down the road, including congestive heart failure (CHF), difficult to treat hypertension, cardiac arrhythmias (particularly A-fib), neurovascular  complications including TIA, stroke and dementia. Even type 2 diabetes has, in part, been linked to untreated OSA. Symptoms of untreated OSA may include (but may not be limited to) daytime sleepiness, nocturia (i.e. frequent nighttime urination), memory problems, mood irritability and suboptimally controlled or worsening mood disorder such as depression and/or anxiety, lack of energy, lack of motivation, physical discomfort, as well as recurrent headaches, especially morning or nocturnal headaches. We talked about the importance of maintaining a healthy lifestyle and striving for healthy weight. In addition, we talked about the importance of striving for and maintaining good sleep hygiene. I recommended the following at this time: sleep study.  I outlined the differences between a laboratory attended sleep study which is considered more comprehensive and accurate over the option of a home sleep test (HST); the latter may lead to underestimation of sleep disordered breathing in some instances and does not help with diagnosing upper airway resistance syndrome and is not accurate enough to diagnose primary central sleep apnea typically. I explained the different sleep test procedures to the patient in detail and also outlined possible surgical and non-surgical treatment options of OSA, including the use of a pressure airway pressure (PAP) device (ie CPAP, AutoPAP/APAP or BiPAP in certain circumstances), a custom-made dental device (aka oral appliance, which would require a referral to a specialist dentist or orthodontist typically, and is generally speaking not considered a good choice for patients with full dentures or edentulous state), upper airway surgical options, such as traditional UPPP (which is not considered a first-line treatment) or the Inspire device (hypoglossal nerve stimulator, which would involve a referral for consultation with an ENT surgeon, after careful selection, following inclusion criteria).  I explained the PAP treatment option to the patient in detail, as this is generally considered first-line treatment.  The patient indicated that he would be willing to try PAP therapy, if the need arises. I explained the importance of being compliant with PAP treatment, not only for insurance purposes but primarily to improve patient's symptoms symptoms, and for the patient's long term health benefit, including to reduce His cardiovascular risks longer-term.    We will pick up our discussion about the next steps and treatment options after testing.  We will keep him posted as to the test results by phone call and/or MyChart messaging where possible.  We will plan to follow-up in sleep clinic accordingly as well.  I answered all his questions today and the patient was in agreement.   I encouraged him to call with any interim questions, concerns, problems or updates or email [place] through [place].  Generally speaking, sleep  test authorizations may take up to 2 weeks, sometimes less, sometimes longer, the patient is encouraged to get in touch with [place] if they do not hear back from the sleep lab staff directly within the next 2 weeks.  Thank you very much for allowing me to participate in the care of this nice patient. If I can be of any further assistance to you please do not hesitate to call me at [phone].  Sincerely,   Star Age, MD, PhD

## 2022-02-20 ENCOUNTER — Encounter: Payer: Self-pay | Admitting: Neurology

## 2022-02-21 NOTE — Telephone Encounter (Signed)
I spoke with the patient.   NPSG- Med[employer] no auth req ref # [id number] I on 02/14/22   Patient is scheduled at [facility] for 02/23/22 at 8 pm.

## 2022-02-23 ENCOUNTER — Ambulatory Visit (INDEPENDENT_AMBULATORY_CARE_PROVIDER_SITE_OTHER): Payer: No Typology Code available for payment source | Admitting: Neurology

## 2022-02-23 DIAGNOSIS — R0683 Snoring: Secondary | ICD-10-CM

## 2022-02-23 DIAGNOSIS — G4733 Obstructive sleep apnea (adult) (pediatric): Secondary | ICD-10-CM

## 2022-02-23 DIAGNOSIS — E669 Obesity, unspecified: Secondary | ICD-10-CM

## 2022-02-23 DIAGNOSIS — G4730 Sleep apnea, unspecified: Secondary | ICD-10-CM

## 2022-02-23 DIAGNOSIS — R351 Nocturia: Secondary | ICD-10-CM

## 2022-02-23 DIAGNOSIS — G4720 Circadian rhythm sleep disorder, unspecified type: Secondary | ICD-10-CM

## 2022-02-23 DIAGNOSIS — Z820 Family history of epilepsy and other diseases of the nervous system: Secondary | ICD-10-CM

## 2022-02-23 DIAGNOSIS — R0681 Apnea, not elsewhere classified: Secondary | ICD-10-CM

## 2022-02-23 DIAGNOSIS — G4719 Other hypersomnia: Secondary | ICD-10-CM

## 2022-03-05 NOTE — Telephone Encounter (Signed)
-----   Message from Huston Foley, MD sent at 03/05/2022  3:59 PM EST ----- Urgent set up requested on PAP therapy, due to severe OSA. Patient referred by PCP, seen by me on 01/22/22, patient had a split night sleep study on 02/23/22. Please call and notify patient that the recent sleep study showed severe  obstructive sleep apnea (OSA). He did very well with CPAP during the study with significant improvement of the respiratory events. I would like start the patient on a new CPAP machine for home use. I placed the order in the chart.  Please advise patient that we will need a follow up appointment with either myself or one of our nurse practitioners in about 2-3 months post set-up to check for how they are doing on treatment and how well it's going with the machine in general. Most insurance company require a certain compliance percentage to continue to cover/pay for the machine. Please ask patient to schedule this FU appointment, according to the set-up date, which is the day they receive the machine. Please make sure, the patient understands the importance of keeping this window for the FU appointment, as it is often an Barista and not our rule. Failing to adhere to this may result in losing coverage for sleep apnea treatment, at which point some insurances require repeating the whole process. Plus, monitoring compliance data is usually good feedback for the patient as far as how they are doing, how many hours they are on it, how well the mask fits, etc.  Also remind patient, that any PAP machine or mask issues should be first addressed with the DME company, who provided the machine/supplies.  Please ask if patient has a preference regarding DME company, may depend on the insurance too.  Huston Foley, MD, PhD [facility])

## 2022-03-05 NOTE — Procedures (Signed)
Physician Interpretation:     [facility] SPLIT NIGHT INTERPRETATION REPORT   STUDY DATE: 02/23/2022     PATIENT NAME:  Manuel Glover         DATE OF BIRTH:  [birth year]  PATIENT ID:  [id number]    TYPE OF STUDY:  PSG  READING PHYSICIAN: Huston Foley, MD, PhD SCORING TECHNICIAN: Domingo Cocking   History: 36 year old male with an underlying medical history of reflux disease, and obesity, who reports snoring and excessive daytime somnolence as well as witnessed apneas and nocturia. ?His Epworth sleepiness score is 16 out of 24, fatigue severity score is 61 out of 63.  Height: 64.0 in Weight: 224 lb (BMI 38) Neck Size: 19.0 in Medications: Prilosec ? DESCRIPTION: A sleep technologist was in attendance for the duration of the recording.  Data collection, scoring, video monitoring, and reporting were performed in compliance with the AASM Manual for the Scoring of Sleep and Associated Events; (Hypopnea is scored based on the criteria listed in Section VIII D. 1b in the AASM Manual V2.6 using a 4% oxygen desaturation rule or Hypopnea is scored based on the criteria listed in Section VIII D. 1a in the AASM Manual V2.6 using 3% oxygen desaturation and /or arousal rule).  A physician certified by the American Board of Sleep Medicine reviewed each epoch of the study.  FINDINGS:  Please refer to the attached summary for additional quantitative information.  STUDY DETAILS: Lights off was at 22:05: and lights on 04:47: (401 minutes hours in bed). This study was performed with an initial diagnostic portion followed by positive airway pressure titration.  DIAGNOSTIC ANALYSIS   SLEEP CONTINUITY AND SLEEP ARCHITECTURE:  The diagnostic portion of the study began at 22:05 and ended at 00:32, for a recording time of 2h, 26.5 minutes.  Total sleep time was 100 minutes minutes (100.0% supine;  0.0% lateral;  0.0% prone, 6.0% REM sleep), with a decreased sleep efficiency at 68.6%.  Sleep latency was normal at 18.0 minutes. REM sleep latency was normal at 72.0 minutes.  Arousal index was 93.7 /hr. Of the total sleep time, the percentage of stage N1 sleep was 14.4%, stage N2 sleep was 79.6%, both increased, stage N3 sleep was absent, and REM sleep was 6.0%. Wake after sleep onset (WASO) time accounted for 28 minutes with mild sleep fragmentation noted.   AROUSAL (Baseline): There were 154.0 arousals in total, for an arousal index of 91.9 arousals/hour.  Of these, 154.0 were identified as respiratory-related arousals (91.9 /hr), 0 were PLM-related arousals (0.0 /hr), and 3 were non-specific arousals (1.8 /hr)   RESPIRATORY MONITORING:  Based on CMS criteria (using a 4% oxygen desaturation rule for scoring hypopneas), there were 223 apneas (188 obstructive; 33 central; 2 mixed), and 5 hypopneas.  Apnea index was 112.2. Hypopnea index was 1.2. The apnea-hypopnea index was 113.4 overall (113.4 supine; 100.0 REM, 100.0 supine REM). There were 0 respiratory effort-related arousals (RERAs).  The RERA index was 0.0 events/hr. Total respiratory disturbance index (RDI) was 113.4 events/hr. RDI results showed: supine RDI  113.4 /hr; non-supine RDI 0.0 /hr; REM RDI 100.0 /hr, supine REM RDI 100.0 /hr.  Based on AASM criteria (using a 3% oxygen desaturation and /or arousal rule for scoring hypopneas), there were 223 apneas (188 obstructive; 33 central; 2 mixed), and 5 hypopneas.  Apnea index was 112.2. Hypopnea index was 1.2. The apnea-hypopnea index was 113.4 overall (113.4 supine; 100.0 REM, 100.0 supine REM). There were 0 respiratory effort-related arousals (RERAs).  Total respiratory disturbance index (RDI) was 113.4 events/hr. RDI results showed: supine RDI 113.4 /hr; non-supine RDI 0.0 /hr; REM RDI 100.0 /hr, supine REM RDI 100.0 /hr.   Respiratory events were associated with    LIMB MOVEMENTS: There were 0 periodic limb movements of sleep (0.0/hr), of which 0 (0.0/hr) were associated with an  arousal.   OXIMETRY: Oxyhemoglobin desaturations (nadir 51%) from a normal baseline (mean 97%). Total time spent at, or below 88% was 42.7 minutes, or 42.4%  of total sleep time.  Total sleep time spent at, or below 88% was 41.3 minutes, or 41.1% of total sleep time.    BODY POSITION: Duration of total sleep and percent of total sleep in their respective position is as follows: supine 100 minutes minutes (100.0%), non-supine 0.0 minutes (0.0%); right 00 minutes minutes (0.0%), left 00 minutes minutes (0.0%), and prone 00 minutes minutes (0.0%). Total supine REM sleep time was 06 minutes minutes (100.0% of total REM sleep).   Analysis of electrocardiogram activity showed the highest heart rate for the baseline portion of the study was 114.0 beats per minute.     TREATMENT ANALYSIS SLEEP CONTINUITY AND SLEEP ARCHITECTURE:  The treatment portion of the study began at 00:32 and ended at 04:47, for a recording time of 4h 15.32m minutes.  Total sleep time was 202 minutes minutes (0.0% supine;  100.0% lateral; 0.0% prone, 40.8% REM sleep), with a decreased sleep efficiency at 79.2%. Sleep latency was normal at 22.0 minutes. REM sleep latency was decreased at 49.0 minutes.  Arousal index was 8.3 /hr. Of the total sleep time, the percentage of stage N1 sleep was 7.2%, stage N3 sleep was 16.3%, and REM sleep was 40.8%. There were 3 Stage R periods observed during this portion of the study, 6 awakenings (i.e. transitions to Stage W from any sleep stage), and 33.0 total stage transitions. Wake after sleep onset (WASO) time accounted for 30 minutes.   AROUSAL: There were 25.0 arousals in total, for an arousal index of 7.4 arousals/hour.  Of these, 21.0 were identified as respiratory-related arousals (6.2 /hr), 0 were PLM-related arousals (0.0 /hr), and 6 were non-specific arousals (1.8 /hr)  RESPIRATORY MONITORING:    While on PAP therapy, based on CMS criteria, the apnea-hypopnea index was 11.3 overall (0.0  supine; 0.9 REM).   While on PAP therapy, based on AASM criteria, the apnea-hypopnea index was 13.7 overall (0.0 supine; 1.5 REM).   Respiratory events were associated with oxyhemoglobin desaturation (nadir 89.0%) from a mean of 98.0%.  Total time spent at, or below 88% was 0.0 minutes, or 0.0%  of total sleep time.  Snoring was absent:  . There were 0.0 occurrences of Cheyne Stokes breathing.  LIMB MOVEMENTS: There were 0 periodic limb movements of sleep (0.0/hr), of which 0 (0.0/hr) were associated with an arousal.   OXIMETRY: Total sleep time spent at, or below 88% was 0.0 minutes, or 0.0% of total sleep time.    BODY POSITION: Duration of total sleep and percent of total sleep in their respective position is as follows: supine 00 minutes minutes (0.0%), non-supine 202.0 minutes (100.0%); right 202 minutes minutes (100.0%), left 00 minutes minutes (0.0%), and prone 00 minutes minutes (0.0%). Total supine REM sleep time was 00 minutes minutes (0.0% of total REM sleep).    TITRATION DETAILS (SEE ALSO TABLE AT THE END OF THE REPORT):  The patient qualified for an emergency split sleep study secondary to severe sleep disordered breathing. The baseline AHI was 113.4/h,  O2 nadir 51%. The patient was shown several different interfaces and was subsequently fitted with a small fullface mask from [employer], [employer]. The patient was started on a pressure of 5 cm of water pressure and gradually titrated to a final titration pressure of 12. The AHI was improved and optimized at a pressure of 12 cm, on which the patient achieved a total sleep time of 51.5 minutes.  Residual AHI was 0/h, O2 nadir 95% with non-supine REM sleep achieved.   EEG: Review of the EEG showed no abnormal electrical discharges and symmetrical bihemispheric findings.     EKG: The EKG revealed normal sinus rhythm (NSR). The average heart rate during sleep was 76 bpm.   AUDIO/VIDEO REVIEW: The audio and video review did not show any  abnormal or unusual behaviors, movements, phonations or vocalizations. The patient took 2 restroom breaks for the night. Snoring was noted in the baseline portion of the study, ranging from mild to loud.  Snoring was alleviated with CPAP therapy   POST-STUDY QUESTIONNAIRE: Post study, the patient indicated, that sleep was better than usual.  He stated that his CPAP experience was great and that he had a deeper, more relaxed sleep than he had had in a long time.    IMPRESSION:  1. Severe obstructive sleep apnea (OSA) 2. Dysfunctions associated with sleep stages or arousal from sleep   RECOMMENDATIONS:  1. This patient has severe obstructive sleep apnea and responded well on CPAP therapy. The patient qualified for an emergency split sleep study secondary to severe sleep disordered breathing. The baseline AHI was 113.4/h, O2 nadir 51%.  CPAP of 12 cm resulted in significant reduction of his sleep disordered breathing.  I recommend home CPAP therapy at a pressure of 12 cm via small Vitera fullface mask with heated humidity, or mask of choice, sized to fit. The patient will be advised to be fully compliant with PAP therapy to improve sleep related symptoms and decrease long term cardiovascular risks. Please note that untreated obstructive sleep apnea may carry additional perioperative morbidity. Patients with significant obstructive sleep apnea should receive perioperative PAP therapy and the surgeons and particularly the anesthesiologist should be informed of the diagnosis and the severity of the sleep disordered breathing. 2. This study shows sleep fragmentation and abnormal sleep stage percentages; these are nonspecific findings and per se do not signify an intrinsic sleep disorder or a cause for the patient's sleep-related symptoms. Causes include (but are not limited to) the first night effect of the sleep study, circadian rhythm disturbances, medication effect or an underlying mood disorder or medical  problem.  3. The patient should be cautioned not to drive, work at heights, or operate dangerous or heavy equipment when tired or sleepy. Review and reiteration of good sleep hygiene measures should be pursued with any patient. 4. The patient will be seen in follow-up in the sleep clinic at [facility] for discussion of the test results, symptom and treatment compliance review, further management strategies, etc. The referring provider will be notified of the test results.  I certify that I have reviewed the entire raw data recording prior to the issuance of this report in accordance with the Standards of Accreditation of the American Academy of Sleep Medicine (AASM). Huston Foley, MD, PhD Medical Director, [facility]  Diplomat, ABPN (Neurology and Sleep)                  Technical Report:   [facility]  Split Summary    General Information  Name: Manuel MariaHeffner, Ved BMI: 38.45 Physician: Huston FoleySaima Priyah Schmuck, MD  ID: [id number] Height: 64.0 in Technician: Domingo CockingMatthew Randall, RPSGT  Sex: Male Weight: 224.0 lb Record: [id number]  Age: [age] [05/19/1985] Date: 02/23/2022     Medical & Medication History    Manuel Glover is a 36 year old male with an underlying medical history of reflux disease, and obesity, who reports snoring and excessive daytime somnolence as well as witnessed apneas and nocturia. His Epworth sleepiness score is 16 out of 24, fatigue severity score is 61 out of 63.  Prilosec   Sleep Disorder      Comments   Patient arrived for a diagnostic polysomnogram. Procedure explained and all questions answered. Standard paste setup without complications. Patient slept supine, right, and left. Mild to loud snoring heard. Severe respiratory events observed. After 110 minutes total sleep time, AHI = 113.4. CPAP was started at 5cm/H2O with a small Vitera full face mask, and increased to 12 cm/H2O in an attempt to control  obstructive respiratory events and abolish snoring. No obvious cardiac arrhythmias observed. No significant PLMS observed. Two restroom visits.    Baseline Sleep Stage Information Baseline start time: 10:05:46 PM Baseline end time: 12:32:21 AM   Time Total Supine Side Prone Upright  Recording 2h 26.3068m 2h 26.6337m 0h 0.2268m 0h 0.3237m 0h 0.7037m  Sleep 1h 40.7368m 1h 40.2068m 0h 0.1537m 0h 0.2237m 0h 0.2337m   Latency N1 N2 N3 REM Onset Per. Slp. Eff.  Actual 0h 0.3237m 0h 4.5568m 0h 0.5637m 1h 12.737m 0h 18.7537m 0h 26.6568m 68.60%   Stg Dur Wake N1 N2 N3 REM  Total 46.0 14.5 80.0 0.0 6.0  Supine 45.5 14.5 80.0 0.0 6.0  Side 0.5 0.0 0.0 0.0 0.0  Prone 0.0 0.0 0.0 0.0 0.0  Upright 0.0 0.0 0.0 0.0 0.0   Stg % Wake N1 N2 N3 REM  Total 31.4 14.4 79.6 0.0 6.0  Supine 31.1 14.4 79.6 0.0 6.0  Side 0.3 0.0 0.0 0.0 0.0  Prone 0.0 0.0 0.0 0.0 0.0  Upright 0.0 0.0 0.0 0.0 0.0    CPAP Sleep Stage Information CPAP start time: 12:32:21 AM CPAP end time: 04:47:33 AM   Time Total Supine Side Prone Upright  Recording (TRT) 4h 15.4837m 0h 0.537m 4h 15.3437m 0h 0.5637m 0h 0.1437m  Sleep (TST) 3h 22.6368m 0h 0.6737m 3h 22.7368m 0h 0.5237m 0h 0.5637m   Latency N1 N2 N3 REM Onset Per. Slp. Eff.  Actual 0h 0.6337m 0h 8.6537m 0h 40.6368m 0h 49.5937m 0h 22.3537m 0h 22.2837m 79.41%   Stg Dur Wake N1 N2 N3 REM  Total 52.5 14.5 72.0 33.0 82.5  Supine 0.0 0.0 0.0 0.0 0.0  Side 52.5 14.5 72.0 33.0 82.5  Prone 0.0 0.0 0.0 0.0 0.0  Upright 0.0 0.0 0.0 0.0 0.0   Stg % Wake N1 N2 N3 REM  Total 20.6 7.2 35.6 16.3 40.7  Supine 0.0 0.0 0.0 0.0 0.0  Side 20.6 7.2 35.6 16.3 40.7  Prone 0.0 0.0 0.0 0.0 0.0  Upright 0.0 0.0 0.0 0.0 0.0    Baseline Respiratory Information Apnea Summary Sub Supine Side Prone Upright  Total 188 Total 188 188 0 0 0    REM 10 10 0 0 0    NREM 178 178 0 0 0  Obs 158 REM 10 10 0 0 0    NREM 148 148 0 0 0  Mix 0 REM 0 0 0 0 0    NREM 0 0 0 0 0  Cen 30 REM 0 0 0 0 0    NREM 30 30 0 0 0   Rera Summary Sub Supine Side Prone Upright  Total 0 Total 0 0 0 0 0     REM 0 0 0 0 0    NREM 0 0 0 0 0   Hypopnea Summary Sub Supine Side Prone Upright  Total 2 Total 2 2 0 0 0    REM 0 0 0 0 0    NREM 2 2 0 0 0   4% Hypopnea Summary Sub Supine Side Prone Upright  Total (4%) 2 Total 2 2 0 0 0    REM 0 0 0 0 0    NREM 2 2 0 0 0     AHI Total Obs Mix Cen  113.43 Apnea 112.24 94.33 0.00 17.91   Hypopnea 1.19 -- -- --  113.43 Hypopnea (4%) 1.19 -- -- --    Total Supine Side Prone Upright  Position AHI 113.43 113.43 0.00 0.00 0.00  REM AHI 100.00   NREM AHI 114.29   Position RDI 113.43 113.43 0.00 0.00 0.00  REM RDI 100.00   NREM RDI 114.29    4% Hypopnea Total Supine Side Prone Upright  Position AHI (4%) 113.43 113.43 0.00 0.00 0.00  REM AHI (4%) 100.00   NREM AHI (4%) 114.29   Position RDI (4%) 113.43 113.43 0.00 0.00 0.00  REM RDI (4%) 100.00   NREM RDI (4%) 114.29    CPAP Respiratory Information Apnea Summary Sub Supine Side Prone Upright  Total 35 Total 35 0 35 0 0    REM 3 0 3 0 0    NREM 32 0 32 0 0  Obs 30 REM 0 0 0 0 0    NREM 30 0 30 0 0  Mix 2 REM 1 0 1 0 0    NREM 1 0 1 0 0  Cen 3 REM 2 0 2 0 0    NREM 1 0 1 0 0   Rera Summary Sub Supine Side Prone Upright  Total 0 Total 0 0 0 0 0    REM 0 0 0 0 0    NREM 0 0 0 0 0   Hypopnea Summary Sub Supine Side Prone Upright  Total 11 Total 11 0 11 0 0    REM 2 0 2 0 0    NREM 9 0 9 0 0   4% Hypopnea Summary Sub Supine Side Prone Upright  Total (4%) 3 Total 3 0 3 0 0    REM 0 0 0 0 0    NREM 3 0 3 0 0     AHI Total Obs Mix Cen  13.63 Apnea 10.37 8.89 0.59 0.89   Hypopnea 3.26 -- -- --  11.26 Hypopnea (4%) 0.89 -- -- --    Total Supine Side Prone Upright  Position AHI 13.63 0.00 13.63 0.00 0.00  REM AHI 3.64   NREM AHI 20.59   Position RDI 13.63 0.00 13.63 0.00 0.00  REM RDI 3.64   NREM RDI 20.59    4% Hypopnea Total Supine Side Prone Upright  Position AHI (4%) 11.26 0.00 11.26 0.00 0.00  REM AHI (4%) 2.18   NREM AHI (4%) 17.57   Position RDI (4%) 11.26 0.00 11.26  0.00 0.00  REM RDI (4%) 2.18   NREM RDI (4%) 17.57    Desaturation Information (Baseline)  <100% <90% <80% <70% <60% <50% <40%  Supine 218  173 104 29 4 0 0  Side 0 0 0 0 0 0 0  Prone 0 0 0 0 0 0 0  Upright 0 0 0 0 0 0 0  Total 218 173 104 29 4 0 0  Desaturation threshold setting: 3% Minimum desaturation setting: 10 seconds SaO2 nadir: 51% The longest event was a 40 sec obstructive Apneawith a minimum SaO2 of 59%. The lowest SaO2 was 58% associated with a 30 sec obstructive Apnea. Awakening/Arousal Information (Baseline) # of Awakenings 8  Wake after sleep onset 28.21m  Wake after persistent sleep 25.48m   Arousal Assoc. Arousals Index  Apneas 154 91.9  Hypopneas 0 0.0  Leg Movements 0 0.0  Snore 0.0 0.0  PTT Arousals 0 0.0  Spontaneous 3 1.8  Total 157 93.7   Desaturation Information (CPAP)  <100% <90% <80% <70% <60% <50% <40%  Supine 0 0 0 0 0 0 0  Side 57 1 0 0 0 0 0  Prone 0 0 0 0 0 0 0  Upright 0 0 0 0 0 0 0  Total 57 1 0 0 0 0 0  Desaturation threshold setting: 3% Minimum desaturation setting: 10 seconds SaO2 nadir: 88% The longest event was a 70 sec obstructive Hypopnea with a minimum SaO2 of 94%. The lowest SaO2 was 90% associated with a 33 sec obstructive Apnea. Awakening/Arousal Information (CPAP) # of Awakenings 6  Wake after sleep onset 30.16m  Wake after persistent sleep 30.1m   Arousal Assoc. Arousals Index  Apneas 17 5.0  Hypopneas 4 1.2  Leg Movements 1 0.3  Snore 0.0 0.0  PTT Arousals 0 0.0  Spontaneous 6 1.8  Total 28 8.3     EKG Rates (Baseline) EKG Avg Max Min  Awake 88 114 72  Asleep 76 92 61  EKG Events: Tachycardia Myoclonus Information (Baseline) PLMS LMs Index  Total LMs during PLMS 0 0.0  LMs w/ Microarousals 0 0.0   LM LMs Index  w/ Microarousal 0 0.0  w/ Awakening 0 0.0  w/ Resp Event 0 0.0  Spontaneous 0 0.0  Total 0 0.0   EKG Rates (CPAP) EKG Avg Max Min  Awake 86 106 67  Asleep 82 106 64  EKG Events:  Tachycardia Myoclonus Information (CPAP) PLMS LMs Index  Total LMs during PLMS 0 0.0  LMs w/ Microarousals 0 0.0   LM LMs Index  w/ Microarousal 1 0.3  w/ Awakening 1 0.3  w/ Resp Event 0 0.0  Spontaneous 0 0.0  Total 1 0.3     Titration Table:  [facility] CPAP/Bilevel Report    General Information  Name: Manuel Glover, Manuel Glover BMI: 38 Physician: Huston Foley, MD   ID: [id number] Height: 64 in Technician: Domingo Cocking  Sex: Male Weight: 224 lb Record: [id number]  Age: 46 [1986-02-20] Date: 02/23/2022 Scorer: Domingo Cocking   Recommended Settings IPAP: N/A cmH20 EPAP: N/A cmH2O AHI: N/A AHI (4%): N/A   Pressure IPAP/EPAP 00 05 07 09 11 12   O2 Vol 0.0 0.0 0.0 0.0 0.0 0.0  Time TRT 146.2m 31.47m 12.56m 14.40m 137.44m 61.57m   TST 100.13m 8.42m 11.49m 13.23m 118.24m 51.23m  Sleep Stage % Wake 31.2 72.6 4.2 7.1 13.9 16.3   % REM 6.0 0.0 0.0 0.0 50.0 45.6   % N1 14.4 94.1 21.7 15.4 0.4 2.9   % N2 79.6 5.9 78.3 84.6 21.6 50.5   % N3 0.0 0.0 0.0 0.0 28.0 0.0  Respiratory Total Events 190  0   Obs. Apn. 158 0 0   Mixed Apn. 0 0 0 1 1 0   Cen. Apn. 30 0 0 0 3 0   Hypopneas 0   AHI 113.43 84.71 93.91 46.15 3.05 0.00   Supine AHI 113.43 0.00 0.00 0.00 0.00 0.00   Prone AHI 0.00 0.00 0.00 0.00 0.00 0.00   Side AHI 0.00 84.71 93.91 46.15 3.05 0.00  Respiratory (4%) Hypopneas (4%) 2.00 0.00 0.00 3.00 0.00 0.00   AHI (4%) 113.43 63.53 88.70 36.92 2.03 0.00   Supine AHI (4%) 113.43 0.00 0.00 0.00 0.00 0.00   Prone AHI (4%) 0.00 0.00 0.00 0.00 0.00 0.00   Side AHI (4%) 0.00 63.53 88.70 36.92 2.03 0.00  Desat Profile <= 90% 53.31m 0.57m 0.38m 0.82m 0.70m 4.17m   <= 80% 18.53m 0.27m 0.60m 0.39m 0.45m 4.78m   <= 70% 4.97m 0.96m 0.22m 0.22m 0.92m 4.77m   <= 60% 0.93m 0.86m 0.37m 0.16m 0.38m 4.41m  Arousal Index Apnea 91.9 0.0 62.6 23.1 0.0 0.0   Hypopnea 0.0 0.0 0.0 18.5 0.0 0.0   LM 0.0 0.0 0.0 0.0 0.0 1.2   Spontaneous 1.8 0.0 10.4 9.2 1.0 0.0

## 2022-03-05 NOTE — Telephone Encounter (Signed)
I called pt. I advised pt that Dr. Frances Furbish  reviewed their sleep study results and found that pt has severe OSA. Dr. Frances Furbish recommends that pt start CPAP. I reviewed PAP compliance expectations with the pt. Pt is agreeable to starting a CPAP. I advised pt that an order will be sent to a DME, [facility], and they will call the pt within about one week after they file with the pt's insurance. They will show the pt how to use the machine, fit for masks, and troubleshoot the CPAP if needed. A follow up appt was made for insurance purposes with [place] on 05-21-2022 at 0900. Pt verbalized understanding to arrive 15 minutes early and bring their CPAP. I verified with the pt that the address we have on file is correct. Pt verbalized understanding of results. Pt had no questions at this time but was encouraged to call back if questions arise. I have sent the order to [facility] and have received confirmation that they have received the order.

## 2022-03-05 NOTE — Addendum Note (Signed)
Addended by: Huston Foley on: 03/05/2022 03:59 PM   Modules accepted: Orders

## 2022-03-06 ENCOUNTER — Telehealth: Payer: Self-pay

## 2022-04-02 ENCOUNTER — Telehealth: Payer: Self-pay | Admitting: Neurology

## 2022-04-02 DIAGNOSIS — G4733 Obstructive sleep apnea (adult) (pediatric): Secondary | ICD-10-CM

## 2022-04-02 NOTE — Addendum Note (Signed)
Addended by: Gildardo Griffes on: 04/02/2022 05:29 PM   Modules accepted: Orders

## 2022-04-02 NOTE — Telephone Encounter (Signed)
Order written for pressure change to DME: 14 cm with EPR of 3. Ramp 20 min, ramp to start at 7 cm per Dr Rexene Alberts.   Changes made in [facility] already. Will fax to [place] for their records once v.o. is signed by Dr Rexene Alberts.

## 2022-04-02 NOTE — Telephone Encounter (Signed)
Pt reports that he got an email that he is having too many events at night.  Pt also reports that a lot of nights he wakes up suffocating and having to take the mask off, please call pt to discuss.

## 2022-04-02 NOTE — Telephone Encounter (Signed)
Please send pressure change to DME: 14 cm with EPR of 3. Ramp 20 min, ramp to start at 7 cm.

## 2022-04-04 NOTE — Telephone Encounter (Signed)
Also clarified with Dr Rexene Alberts ramp time decrease to 10 minutes and start pressure of 8. I have made these changes in [facility] and sent the order to [place] for their records. Received a receipt of confirmation.

## 2022-04-04 NOTE — Addendum Note (Signed)
Addended by: Star Age on: 04/04/2022 10:24 AM   Modules accepted: Orders

## 2022-04-04 NOTE — Telephone Encounter (Signed)
At this juncture, let's change him from CPAP to an AutoPap setting of 8 to 18 cm.  Order placed.

## 2022-05-23 ENCOUNTER — Encounter: Payer: Self-pay | Admitting: Adult Health

## 2022-05-23 ENCOUNTER — Ambulatory Visit (INDEPENDENT_AMBULATORY_CARE_PROVIDER_SITE_OTHER): Payer: No Typology Code available for payment source | Admitting: Adult Health

## 2022-05-23 VITALS — BP 128/71 | HR 72 | Ht 64.0 in | Wt 236.6 lb

## 2022-05-23 DIAGNOSIS — G4733 Obstructive sleep apnea (adult) (pediatric): Secondary | ICD-10-CM

## 2022-05-23 NOTE — Progress Notes (Signed)
PATIENT: Manuel Glover DOB: [birth year]  REASON FOR VISIT: follow up HISTORY FROM: patient PRIMARY NEUROLOGIST: Dr. Rexene Alberts  Chief Complaint  Patient presents with   Follow-up    Pt in 18 Pt here for CPAP f/u  Pt states no questions or concerns for this visit      HISTORY OF PRESENT ILLNESS: Today 05/23/22:  Manuel Glover is a 37 y.o. male with a history of OSA on CPAP. Returns today for follow-up. Download shows improvement in his apnea but AHI is still elevated. He can already tell a difference in how he feels. More energy. Not falling asleep during the day. No longer having to get up during the night to urinate.  Patient states that he does feel that he could use more pressure.  DL is below.       REVIEW OF SYSTEMS: Out of a complete 14 system review of symptoms, the patient complains only of the following symptoms, and all other reviewed systems are negative.  FSS 10 ESS 2  ALLERGIES: Allergies  Allergen Reactions   Penicillins Hives and Swelling    .Marland KitchenHas patient had a PCN reaction causing immediate rash, facial/tongue/throat swelling, SOB or lightheadedness with hypotension: No Has patient had a PCN reaction causing severe rash involving mucus membranes or skin necrosis: No Has patient had a PCN reaction that required hospitalization Yes Has patient had a PCN reaction occurring within the last 10 years: No If all of the above answers are "NO", then may proceed with Cephalosporin use.     HOME MEDICATIONS: Outpatient Medications Prior to Visit  Medication Sig Dispense Refill   omeprazole (PRILOSEC OTC) 20 MG tablet Take 20 mg by mouth daily. Reported on 07/20/2015     No facility-administered medications prior to visit.    PAST MEDICAL HISTORY: Past Medical History:  Diagnosis Date   GERD (gastroesophageal reflux disease)     PAST SURGICAL HISTORY: Past Surgical History:  Procedure Laterality Date   EXTERNAL EAR SURGERY      FAMILY HISTORY: Family  History  Problem Relation Age of Onset   Sleep apnea Father    Cancer Maternal Uncle        accute lymphoma   Cancer Maternal Grandmother        lymphoma    SOCIAL HISTORY: Social History   Socioeconomic History   Marital status: Married    Spouse name: Not on file   Number of children: Not on file   Years of education: Not on file   Highest education level: Not on file  Occupational History   Not on file  Tobacco Use   Smoking status: Never   Smokeless tobacco: Never  Vaping Use   Vaping Use: Never used  Substance and Sexual Activity   Alcohol use: Yes    Comment: Beer on occ   Drug use: No   Sexual activity: Yes    Partners: Female  Other Topics Concern   Not on file  Social History Narrative   Caffiene 1 serving daily   Lives with wife, and one child   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  Generalized: Well developed, in no acute distress  Chest: Lungs clear to auscultation bilaterally  Neurological examination  Mentation: Alert oriented to time, place, history taking. Follows all commands speech and language fluent Cranial nerve II-XII: Facial symmetry noted   DIAGNOSTIC DATA (LABS, IMAGING, TESTING) - I reviewed patient records, labs, notes, testing and imaging myself where available.  Lab Results  Component Value Date   WBC 9.4 12/15/2021   HGB 15.5 12/15/2021   HCT 45.7 12/15/2021   MCV 86 12/15/2021   PLT 314 12/15/2021      Component Value Date/Time   NA 138 12/15/2021 1439   K 4.1 12/15/2021 1439   CL 99  12/15/2021 1439   CO2 23 12/15/2021 1439   GLUCOSE 83 12/15/2021 1439   GLUCOSE 94 08/25/2018 1432   BUN 10 12/15/2021 1439   CREATININE 1.04 12/15/2021 1439   CALCIUM 9.1 12/15/2021 1439   PROT 7.4 12/15/2021 1439   ALBUMIN 4.8 12/15/2021 1439   AST 17 12/15/2021 1439   ALT 20 12/15/2021 1439   ALKPHOS 71 12/15/2021 1439   BILITOT <0.2 12/15/2021 1439   GFRNONAA >60 08/25/2018 1432   GFRAA >60 08/25/2018 1432   Lab Results  Component Value Date   CHOL 226 (H) 12/15/2021   HDL 43 12/15/2021   LDLCALC 130 (H) 12/15/2021   TRIG 295 (H) 12/15/2021   CHOLHDL 5.3 (H) 12/15/2021     ASSESSMENT AND PLAN 37 y.o. year old male  has a past medical history of GERD (gastroesophageal reflux disease). here with:  OSA on CPAP  - CPAP compliance excellent -Residual AHI has improved but still slightly elevated - Encourage patient to use CPAP nightly and > 4 hours each night - Order sent to increase pressure 10-20 and for new supplies - F/U in 1 year or sooner if needed    Ward Givens, MSN, NP-C 05/23/2022, 9:17 AM [facility] [address], [place], [place] [address] [phone]

## 2022-05-24 ENCOUNTER — Encounter: Payer: Self-pay | Admitting: Adult Health

## 2022-07-24 ENCOUNTER — Ambulatory Visit: Payer: No Typology Code available for payment source | Admitting: Family Medicine

## 2022-07-24 VITALS — BP 102/64 | HR 100 | Temp 98.2°F | Ht 64.0 in | Wt 233.0 lb

## 2022-07-24 DIAGNOSIS — H6120 Impacted cerumen, unspecified ear: Secondary | ICD-10-CM | POA: Insufficient documentation

## 2022-07-24 DIAGNOSIS — L409 Psoriasis, unspecified: Secondary | ICD-10-CM | POA: Diagnosis not present

## 2022-07-24 DIAGNOSIS — M7918 Myalgia, other site: Secondary | ICD-10-CM | POA: Diagnosis not present

## 2022-07-24 DIAGNOSIS — H6123 Impacted cerumen, bilateral: Secondary | ICD-10-CM

## 2022-07-24 MED ORDER — TIZANIDINE HCL 4 MG PO TABS: 4.0000 mg | ORAL_TABLET | Freq: Three times a day (TID) | ORAL | 0 refills | Status: DC | PRN

## 2022-07-24 MED ORDER — CLOBETASOL PROPIONATE 0.05 % EX OINT: 1.0000 | TOPICAL_OINTMENT | Freq: Two times a day (BID) | CUTANEOUS | 0 refills | Status: DC

## 2022-07-24 NOTE — Assessment & Plan Note (Signed)
Lavage removed a large portion of wax but impaction persists.  Recommend OTC Debrox.

## 2022-07-24 NOTE — Assessment & Plan Note (Signed)
Topical clobetasol. Referring to Dermatology. May need biopsy.

## 2022-07-24 NOTE — Patient Instructions (Signed)
Rest. Heat.  Medications as prescribed.  Referral placed to Dermatology.  Take care  Dr. Adriana Simas

## 2022-07-24 NOTE — Assessment & Plan Note (Addendum)
Zanaflex as prescribed. Recommended continued use of heat.  Examination of the foot unmarkable.

## 2022-07-24 NOTE — Progress Notes (Signed)
Subjective:  Patient ID: Manuel Glover, male    DOB: 05-26-1985  Age: 37 y.o. MRN: [id number]  CC: Chief Complaint  Patient presents with  . Neck Pain    With numb, tingling in right shoulder, and chest right side  . left ear muffled hearing     HPI:  37 year old male presents with multiple complaints.  Patient reports that for the past few months he has had intermittent pain of the posterior neck and around the scapula.  He does have some numbness and tingling around the right scapula.  Worse with range of motion of the neck/turning his head.  Heat seems to help.  No known fall, trauma, injury.  Patient also reports muffled hearing out of his left ear.  Some associated discomfort.  Additionally, patient reports ongoing rash. Has a history of psoriasis. It is primarily on the lower extremities but he has some raised papules on his upper extremities as well.   Lastly, he has had an area of swelling on the dorsum on the left foot and some associated pain.    Patient Active Problem List   Diagnosis Date Noted  . Musculoskeletal pain 07/24/2022  . Psoriasis 07/24/2022  . Cerumen impaction 07/24/2022  . Obesity (BMI 30-39.9) 12/15/2021  . OSA (obstructive sleep apnea) 12/15/2021  . GERD (gastroesophageal reflux disease) 11/11/2012    Social Hx   Social History   Socioeconomic History  . Marital status: Married    Spouse name: Not on file  . Number of children: Not on file  . Years of education: Not on file  . Highest education level: 9th grade  Occupational History  . Not on file  Tobacco Use  . Smoking status: Never  . Smokeless tobacco: Never  Vaping Use  . Vaping Use: Never used  Substance and Sexual Activity  . Alcohol use: Yes    Comment: Beer on occ  . Drug use: No  . Sexual activity: Yes    Partners: Female  Other Topics Concern  . 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 Difficulty of Paying Living Expenses: Not very hard  Food Insecurity: No Food Insecurity (07/23/2022)   Hunger Vital Sign   . Worried About Programme researcher, broadcasting/film/video in the Last Year: Never true   . Ran Out of Food in the Last Year: Never true  Transportation Needs: No Transportation Needs (07/23/2022)   PRAPARE - Transportation   . Lack of Transportation (Medical): No   . Lack of Transportation (Non-Medical): No  Physical Activity: Insufficiently Active (07/23/2022)   Exercise Vital Sign   . Days of Exercise per Week: 1 day   . Minutes of Exercise per Session: 60 min  Stress: No Stress Concern Present (07/23/2022)   [employer] of Occupational Health - Occupational Stress Questionnaire   . Feeling of Stress : Only a little  Social Connections: Moderately Integrated (07/23/2022)   Social Connection and Isolation Panel [NHANES]   . Frequency of Communication with Friends and Family: More than three times a week   . Frequency of Social Gatherings with Friends and Family: Once a week   . Attends Religious Services: 1 to 4 times per year   . Active Member of  Clubs or Organizations: No   . Attends Banker Meetings: Not on file   . Marital Status: Married    Review of Systems Per HPI  Objective:  BP 102/64   Pulse 100   Temp 98.2 F (36.8 C)   Ht 5\' 4"  (1.626 m)   Wt 233 lb (105.7 kg)   SpO2 97%   BMI 39.99 kg/m      07/24/2022    3:33 PM 05/23/2022    9:14 AM 01/22/2022    8:44 AM  BP/Weight  Systolic BP 102 128 127  Diastolic BP 64 71 80  Wt. (Lbs) 233 236.6 224  BMI 39.99 kg/m2 40.61 kg/m2 38.45 kg/m2    Physical Exam Vitals and nursing note reviewed.  Constitutional:      General: He is not in acute distress.    Appearance: Normal appearance. He is obese. He is not ill-appearing.  HENT:     Right Ear: There  is impacted cerumen.     Left Ear: There is impacted cerumen.  Cardiovascular:     Rate and Rhythm: Normal rate and regular rhythm.  Pulmonary:     Effort: Pulmonary effort is normal.     Breath sounds: Normal breath sounds. No wheezing, rhonchi or rales.  Musculoskeletal:       Back:     Comments: Tension and tenderness noted at the labeled locations.   Feet:     Comments: Normal examination of the left foot. Skin:    Comments: Patient's ankles with hyperpigmented, raised and dry rash.  Patient has several scattered flesh-colored papules on the lower extremities as well as on the arms.  Neurological:     Mental Status: He is alert.   Lab Results  Component Value Date   WBC 9.4 12/15/2021   HGB 15.5 12/15/2021   HCT 45.7 12/15/2021   PLT 314 12/15/2021   GLUCOSE 83 12/15/2021   CHOL 226 (H) 12/15/2021   TRIG 295 (H) 12/15/2021   HDL 43 12/15/2021   LDLCALC 130 (H) 12/15/2021   ALT 20 12/15/2021   AST 17 12/15/2021   NA 138 12/15/2021   K 4.1 12/15/2021   CL 99 12/15/2021   CREATININE 1.04 12/15/2021   BUN 10 12/15/2021   CO2 23 12/15/2021     Assessment & Plan:   Problem List Items Addressed This Visit       Nervous and Auditory   Cerumen impaction    Lavage removed a large portion of wax but impaction persists.  Recommend OTC Debrox.        Musculoskeletal and Integument   Psoriasis    Topical clobetasol. Referring to Dermatology. May need biopsy.      Relevant Orders   Ambulatory referral to Dermatology     Other   Musculoskeletal pain - Primary    Zanaflex as prescribed. Recommended continued use of heat.  Examination of the foot unmarkable.        Meds ordered this encounter  Medications  . clobetasol ointment (TEMOVATE) 0.05 %    Sig: Apply 1 Application topically 2 (two) times daily.    Dispense:  30 g    Refill:  0  . tiZANidine (ZANAFLEX) 4 MG tablet    Sig: Take 1 tablet (4 mg total) by mouth every 8 (eight) hours as needed for  muscle spasms.    Dispense:  30 tablet    Refill:  0    Aayliah Rotenberry DO [facility]

## 2022-09-25 ENCOUNTER — Telehealth: Payer: Self-pay | Admitting: Adult Health

## 2022-09-25 NOTE — Telephone Encounter (Signed)
I called patient. I encouraged him to check with PCP about this further since Megan, NP agrees that his R chest soreness is unlikely to be related to CPAP. Pt verbalized understanding.

## 2022-09-25 NOTE — Telephone Encounter (Signed)
Pt stated she needs to talk to nurse about adjustments that was made to his machine. Pt requesting a call back from nurse.

## 2022-09-25 NOTE — Telephone Encounter (Signed)
I do not think this will be related to CPAP.

## 2022-09-25 NOTE — Telephone Encounter (Signed)
I called patient. He reports that for the past 1-1.5 months, he has experienced R chest soreness. He was checked out by PCP, but it was unclear where the soreness could be coming from. He reports alternating sleeping on his back & right side. He's wondering if the CPAP could be causing soreness in his chest. I advised him that I did not think it was likely since its not bilateral soreness and only recently became an issue (started CPAP 03/22/22). However, I would let Megan, NP know and ask her to review the compliance data and for any other suggestions. Patient is agreeable.

## 2022-09-26 ENCOUNTER — Telehealth: Payer: Self-pay | Admitting: Adult Health

## 2022-09-26 NOTE — Telephone Encounter (Signed)
LVM and sent mychart msg informing pt of r/s needed for 10/17 appt- NP out.

## 2022-11-23 ENCOUNTER — Ambulatory Visit: Payer: No Typology Code available for payment source | Admitting: Nurse Practitioner

## 2022-11-23 ENCOUNTER — Encounter: Payer: Self-pay | Admitting: Nurse Practitioner

## 2022-11-23 VITALS — BP 111/71 | HR 86 | Temp 98.2°F | Wt 226.2 lb

## 2022-11-23 DIAGNOSIS — K625 Hemorrhage of anus and rectum: Secondary | ICD-10-CM | POA: Diagnosis not present

## 2022-11-23 NOTE — Progress Notes (Unsigned)
   Subjective:    Patient ID: Manuel Glover, male    DOB: 1985-05-12, 37 y.o.   MRN: [id number]  HPI Patient states he has been seeing blood after bowel movement. Patient states symptoms started on 11/21/22. Patient stated he had a similar episode 4 years and was advised it was an internal Hemorid. Patient states he has continued seeing blood on toilet paper after bowel movement. Patient has LRQ pain. Patient also states he has been having right neck pain.   Review of Systems     Objective:   Physical Exam        Assessment & Plan:

## 2022-11-25 ENCOUNTER — Encounter: Payer: Self-pay | Admitting: Nurse Practitioner

## 2022-11-25 DIAGNOSIS — K625 Hemorrhage of anus and rectum: Secondary | ICD-10-CM | POA: Insufficient documentation

## 2022-11-25 MED ORDER — HYDROCORTISONE (PERIANAL) 2.5 % EX CREA: 1.0000 | TOPICAL_CREAM | Freq: Two times a day (BID) | CUTANEOUS | 0 refills | Status: DC

## 2022-11-28 ENCOUNTER — Ambulatory Visit: Payer: No Typology Code available for payment source | Admitting: Family Medicine

## 2022-12-03 ENCOUNTER — Ambulatory Visit: Payer: No Typology Code available for payment source | Admitting: Family Medicine

## 2022-12-17 ENCOUNTER — Encounter: Payer: No Typology Code available for payment source | Admitting: Family Medicine

## 2022-12-20 NOTE — Progress Notes (Unsigned)
PATIENT: Manuel Glover DOB: Jul 09, 1985  REASON FOR VISIT: follow up HISTORY FROM: patient PRIMARY NEUROLOGIST: Dr. Frances Furbish  No chief complaint on file.    HISTORY OF PRESENT ILLNESS: Today 12/23/22:  Manuel Glover is a 37 y.o. male with a history of OSA on CPAP. Returns today for follow-up.        05/23/22: Manuel Glover is a 37 y.o. male with a history of OSA on CPAP. Returns today for follow-up. Download shows improvement in his apnea but AHI is still elevated. He can already tell a difference in how he feels. More energy. Not falling asleep during the day. No longer having to get up during the night to urinate.  Patient states that he does feel that he could use more pressure.  DL is below.       REVIEW OF SYSTEMS: Out of a complete 14 system review of symptoms, the patient complains only of the following symptoms, and all other reviewed systems are negative.  FSS 10 ESS 2  ALLERGIES: Allergies  Allergen Reactions   Penicillins Hives and Swelling    .Marland KitchenHas patient had a PCN reaction causing immediate rash, facial/tongue/throat swelling, SOB or lightheadedness with hypotension: No Has patient had a PCN reaction causing severe rash involving mucus membranes or skin necrosis: No Has patient had a PCN reaction that required hospitalization Yes Has patient had a PCN reaction occurring within the last 10 years: No If all of the above answers are "NO", then may proceed with Cephalosporin use.     HOME MEDICATIONS: Outpatient Medications Prior to Visit  Medication Sig Dispense Refill   clobetasol ointment (TEMOVATE) 0.05 % Apply 1 Application topically 2 (two) times daily. 30 g 0   hydrocortisone (ANUSOL-HC) 2.5 % rectal cream Place 1 Application rectally 2 (two) times daily. 30 g 0   omeprazole (PRILOSEC OTC) 20 MG tablet Take 20 mg by mouth daily. Reported on 07/20/2015     No facility-administered medications prior to visit.    PAST MEDICAL HISTORY: Past Medical  History:  Diagnosis Date   GERD (gastroesophageal reflux disease)     PAST SURGICAL HISTORY: Past Surgical History:  Procedure Laterality Date   EXTERNAL EAR SURGERY      FAMILY HISTORY: Family History  Problem Relation Age of Onset   Sleep apnea Father    Cancer Maternal Uncle        accute lymphoma   Cancer Maternal Grandmother        lymphoma    SOCIAL HISTORY: Social History   Socioeconomic History   Marital status: Married    Spouse name: Not on file   Number of children: Not on file   Years of education: Not on file   Highest education level: 9th grade  Occupational History   Not on file  Tobacco Use   Smoking status: Never   Smokeless tobacco: Never  Vaping Use   Vaping status: Never Used  Substance and Sexual Activity   Alcohol use: Yes    Comment: Beer on occ   Drug use: No   Sexual activity: Yes    Partners: Female  Other Topics Concern   Not on file  Social History Narrative   Caffiene 1 serving daily   Lives with wife, and one child   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   Social Determinants of Health   Financial Resource Strain: Low Risk  (07/23/2022)   Overall Financial Resource Strain (CARDIA)    Difficulty of  Paying Living Expenses: Not very hard  Food Insecurity: No Food Insecurity (07/23/2022)   Hunger Vital Sign    Worried About Running Out of Food in the Last Year: Never true    Ran Out of Food in the Last Year: Never true  Transportation Needs: No Transportation Needs (07/23/2022)   PRAPARE - Administrator, Civil Service (Medical): No    Lack of Transportation (Non-Medical): No  Physical Activity: Insufficiently Active (07/23/2022)   Exercise Vital Sign    Days of Exercise per Week: 1 day    Minutes of Exercise per Session: 60 min  Stress: No Stress Concern Present (07/23/2022)   [employer] of Occupational Health - Occupational Stress Questionnaire    Feeling of Stress : Only a little  Social Connections:  Moderately Integrated (07/23/2022)   Social Connection and Isolation Panel [NHANES]    Frequency of Communication with Friends and Family: More than three times a week    Frequency of Social Gatherings with Friends and Family: Once a week    Attends Religious Services: 1 to 4 times per year    Active Member of [employer] or Organizations: No    Attends Engineer, structural: Not on file    Marital Status: Married  Catering manager Violence: Not on file      PHYSICAL EXAM  There were no vitals filed for this visit.  There is no height or weight on file to calculate BMI.  Generalized: Well developed, in no acute distress  Chest: Lungs clear to auscultation bilaterally  Neurological examination  Mentation: Alert oriented to time, place, history taking. Follows all commands speech and language fluent Cranial nerve II-XII: Facial symmetry noted   DIAGNOSTIC DATA (LABS, IMAGING, TESTING) - I reviewed patient records, labs, notes, testing and imaging myself where available.  Lab Results  Component Value Date   WBC 9.4 12/15/2021   HGB 15.5 12/15/2021   HCT 45.7 12/15/2021   MCV 86 12/15/2021   PLT 314 12/15/2021      Component Value Date/Time   NA 138 12/15/2021 1439   K 4.1 12/15/2021 1439   CL 99 12/15/2021 1439   CO2 23 12/15/2021 1439   GLUCOSE 83 12/15/2021 1439   GLUCOSE 94 08/25/2018 1432   BUN 10 12/15/2021 1439   CREATININE 1.04 12/15/2021 1439   CALCIUM 9.1 12/15/2021 1439   PROT 7.4 12/15/2021 1439   ALBUMIN 4.8 12/15/2021 1439   AST 17 12/15/2021 1439   ALT 20 12/15/2021 1439   ALKPHOS 71 12/15/2021 1439   BILITOT <0.2 12/15/2021 1439   GFRNONAA >60 08/25/2018 1432   GFRAA >60 08/25/2018 1432   Lab Results  Component Value Date   CHOL 226 (H) 12/15/2021   HDL 43 12/15/2021   LDLCALC 130 (H) 12/15/2021   TRIG 295 (H) 12/15/2021   CHOLHDL 5.3 (H) 12/15/2021     ASSESSMENT AND PLAN 37 y.o. year old male  has a past medical history of GERD  (gastroesophageal reflux disease). here with:  OSA on CPAP  - CPAP compliance excellent -Residual AHI has improved but still slightly elevated - Encourage patient to use CPAP nightly and > 4 hours each night - Order sent to increase pressure 10-20 and for new supplies - F/U in 1 year or sooner if needed    Butch Penny, MSN, NP-C 12/23/2022, 1:40 PM [facility] [address], [address] [place], [place] [address] [phone]

## 2022-12-24 ENCOUNTER — Encounter: Payer: Self-pay | Admitting: Adult Health

## 2022-12-24 ENCOUNTER — Ambulatory Visit (INDEPENDENT_AMBULATORY_CARE_PROVIDER_SITE_OTHER): Payer: No Typology Code available for payment source | Admitting: Adult Health

## 2022-12-24 VITALS — BP 122/73 | HR 86 | Ht 64.0 in | Wt 227.8 lb

## 2022-12-24 DIAGNOSIS — G4733 Obstructive sleep apnea (adult) (pediatric): Secondary | ICD-10-CM | POA: Diagnosis not present

## 2023-01-03 ENCOUNTER — Ambulatory Visit (HOSPITAL_COMMUNITY)
Admission: RE | Admit: 2023-01-03 | Discharge: 2023-01-03 | Disposition: A | Discharge status: Home / Self Care | Payer: No Typology Code available for payment source | Source: Ambulatory Visit | Attending: Family Medicine | Admitting: Family Medicine

## 2023-01-03 ENCOUNTER — Ambulatory Visit: Payer: No Typology Code available for payment source | Admitting: Adult Health

## 2023-01-03 ENCOUNTER — Ambulatory Visit: Payer: No Typology Code available for payment source | Admitting: Family Medicine

## 2023-01-03 VITALS — BP 134/88 | HR 96 | Temp 99.3°F | Ht 64.0 in | Wt 230.0 lb

## 2023-01-03 DIAGNOSIS — E785 Hyperlipidemia, unspecified: Secondary | ICD-10-CM

## 2023-01-03 DIAGNOSIS — G8929 Other chronic pain: Secondary | ICD-10-CM | POA: Insufficient documentation

## 2023-01-03 DIAGNOSIS — Z0001 Encounter for general adult medical examination with abnormal findings: Secondary | ICD-10-CM

## 2023-01-03 DIAGNOSIS — R079 Chest pain, unspecified: Secondary | ICD-10-CM

## 2023-01-03 DIAGNOSIS — E669 Obesity, unspecified: Secondary | ICD-10-CM

## 2023-01-03 DIAGNOSIS — M7918 Myalgia, other site: Secondary | ICD-10-CM

## 2023-01-03 DIAGNOSIS — Z0000 Encounter for general adult medical examination without abnormal findings: Secondary | ICD-10-CM | POA: Insufficient documentation

## 2023-01-03 DIAGNOSIS — Z130 Encounter for screening for diseases of the blood and blood-forming organs and certain disorders involving the immune mechanism: Secondary | ICD-10-CM

## 2023-01-03 MED ORDER — MELOXICAM 15 MG PO TABS: 15.0000 mg | ORAL_TABLET | Freq: Every day | ORAL | 0 refills | Status: DC | PRN

## 2023-01-03 NOTE — Patient Instructions (Signed)
Labs and chest xray ordered.  Medication as directed.  Follow up annually or sooner if needed.  Take care  Dr. Adriana Simas

## 2023-01-03 NOTE — Assessment & Plan Note (Signed)
I suspect that patient's chest discomfort is skill skeletal in origin.  I do not feel that this is cardiac in origin given age and location. Patient concerned.  Chest x-ray today for further evaluation.  Meloxicam as directed.

## 2023-01-03 NOTE — Progress Notes (Signed)
Subjective:  Patient ID: Manuel Glover, male    DOB: 11-16-1985  Age: 37 y.o. MRN: [id number]  CC:  Annual physical  HPI:  37 year old male presents for an annual physical exam.  Patient endorses compliance with CPAP.  Patient states that he continues to have focal right sided chest pain.  This has been going on for several months.  He lifts heavy objects at work.  He is concerned about this.  Patient declines flu vaccine.  Unsure of his last tetanus.  Preventative health care has been updated in the EMR.  Patient Active Problem List   Diagnosis Date Noted   Annual physical exam 01/03/2023   Musculoskeletal pain 07/24/2022   Psoriasis 07/24/2022   Obesity (BMI 30-39.9) 12/15/2021   OSA (obstructive sleep apnea) 12/15/2021   GERD (gastroesophageal reflux disease) 11/11/2012    Social Hx   Social History   Socioeconomic History   Marital status: Married    Spouse name: Not on file   Number of children: Not on file   Years of education: Not on file   Highest education level: 9th grade  Occupational History   Not on file  Tobacco Use   Smoking status: Never   Smokeless tobacco: Never  Vaping Use   Vaping status: Never Used  Substance and Sexual Activity   Alcohol use: Yes    Alcohol/week: 2.0 standard drinks of alcohol    Types: 2 Glasses of wine per week   Drug use: No   Sexual activity: Yes    Partners: Female  Other Topics Concern   Not on file  Social History Narrative   Caffiene 1 serving daily   Lives with wife, and one child   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   Social Determinants of Health   Financial Resource Strain: Low Risk  (07/23/2022)   Overall Financial Resource Strain (CARDIA)    Difficulty of Paying Living Expenses: Not very hard  Food Insecurity: No Food Insecurity (07/23/2022)   Hunger Vital Sign    Worried About Running Out of Food in the Last Year: Never true    Ran Out of Food in the Last Year: Never true  Transportation Needs:  No Transportation Needs (07/23/2022)   PRAPARE - Administrator, Civil Service (Medical): No    Lack of Transportation (Non-Medical): No  Physical Activity: Insufficiently Active (07/23/2022)   Exercise Vital Sign    Days of Exercise per Week: 1 day    Minutes of Exercise per Session: 60 min  Stress: No Stress Concern Present (07/23/2022)   [employer] of Occupational Health - Occupational Stress Questionnaire    Feeling of Stress : Only a little  Social Connections: Moderately Integrated (07/23/2022)   Social Connection and Isolation Panel [NHANES]    Frequency of Communication with Friends and Family: More than three times a week    Frequency of Social Gatherings with Friends and Family: Once a week    Attends Religious Services: 1 to 4 times per year    Active Member of [employer] or Organizations: No    Attends Engineer, structural: Not on file    Marital Status: Married    Review of Systems Per HPI  Objective:  BP 134/88   Pulse 96   Temp 99.3 F (37.4 C) (Oral)   Ht 5\' 4"  (1.626 m)   Wt 230 lb (104.3 kg)   SpO2 97%   BMI 39.48 kg/m      01/03/2023  8:59 AM 12/24/2022    2:43 PM 11/23/2022    3:14 PM  BP/Weight  Systolic BP 134 122 111  Diastolic BP 88 73 71  Wt. (Lbs) 230 227.8 226.2  BMI 39.48 kg/m2 39.1 kg/m2 38.83 kg/m2    Physical Exam Constitutional:      General: He is not in acute distress.    Appearance: Normal appearance.  HENT:     Head: Normocephalic and atraumatic.  Eyes:     General:        Right eye: No discharge.        Left eye: No discharge.     Conjunctiva/sclera: Conjunctivae normal.  Cardiovascular:     Rate and Rhythm: Normal rate and regular rhythm.  Pulmonary:     Effort: Pulmonary effort is normal.     Breath sounds: Normal breath sounds. No wheezing, rhonchi or rales.  Chest:     Chest wall: Tenderness present.       Comments: Labeled location tender to palpation. Neurological:     Mental Status: He is  alert.  Psychiatric:        Mood and Affect: Mood normal.        Behavior: Behavior normal.     Lab Results  Component Value Date   WBC 9.4 12/15/2021   HGB 15.5 12/15/2021   HCT 45.7 12/15/2021   PLT 314 12/15/2021   GLUCOSE 83 12/15/2021   CHOL 226 (H) 12/15/2021   TRIG 295 (H) 12/15/2021   HDL 43 12/15/2021   LDLCALC 130 (H) 12/15/2021   ALT 20 12/15/2021   AST 17 12/15/2021   NA 138 12/15/2021   K 4.1 12/15/2021   CL 99 12/15/2021   CREATININE 1.04 12/15/2021   BUN 10 12/15/2021   CO2 23 12/15/2021     Assessment & Plan:   Problem List Items Addressed This Visit       Other   Obesity (BMI 30-39.9)   Relevant Orders   CMP14+EGFR   Annual physical exam - Primary    Declines immunizations today.  Healthcare maintenance section updated.  Labs ordered.      Musculoskeletal pain    I suspect that patient's chest discomfort is skill skeletal in origin.  I do not feel that this is cardiac in origin given age and location. Patient concerned.  Chest x-ray today for further evaluation.  Meloxicam as directed.      Other Visit Diagnoses     Screening for deficiency anemia       Relevant Orders   CBC   Hyperlipidemia, unspecified hyperlipidemia type       Relevant Orders   Lipid panel   Chronic chest pain       Relevant Medications   meloxicam (MOBIC) 15 MG tablet   Other Relevant Orders   DG Chest 2 View       Meds ordered this encounter  Medications   meloxicam (MOBIC) 15 MG tablet    Sig: Take 1 tablet (15 mg total) by mouth daily as needed for pain.    Dispense:  30 tablet    Refill:  0    Follow-up:  Return in about 1 year (around 01/03/2024) for Annual physical.  Everlene Other DO [facility]

## 2023-01-03 NOTE — Assessment & Plan Note (Signed)
Declines immunizations today.  Healthcare maintenance section updated.  Labs ordered.

## 2023-01-04 LAB — CMP14+EGFR
ALT: 20 [IU]/L (ref 0–44)
AST: 17 [IU]/L (ref 0–40)
Albumin: 4.5 g/dL (ref 4.1–5.1)
Alkaline Phosphatase: 72 [IU]/L (ref 44–121)
BUN/Creatinine Ratio: 8 — ABNORMAL LOW (ref 9–20)
BUN: 9 mg/dL (ref 6–20)
Bilirubin Total: 0.3 mg/dL (ref 0.0–1.2)
CO2: 23 mmol/L (ref 20–29)
Calcium: 9.5 mg/dL (ref 8.7–10.2)
Chloride: 105 mmol/L (ref 96–106)
Creatinine, Ser: 1.12 mg/dL (ref 0.76–1.27)
Globulin, Total: 2.9 g/dL (ref 1.5–4.5)
Glucose: 102 mg/dL — ABNORMAL HIGH (ref 70–99)
Potassium: 4.9 mmol/L (ref 3.5–5.2)
Sodium: 142 mmol/L (ref 134–144)
Total Protein: 7.4 g/dL (ref 6.0–8.5)
eGFR: 87 mL/min/{1.73_m2} (ref >59)

## 2023-01-04 LAB — LIPID PANEL
Chol/HDL Ratio: 3.6 {ratio} (ref 0.0–5.0)
Cholesterol, Total: 200 mg/dL — ABNORMAL HIGH (ref 100–199)
HDL: 55 mg/dL (ref >39)
LDL Chol Calc (NIH): 134 mg/dL — ABNORMAL HIGH (ref 0–99)
Triglycerides: 62 mg/dL (ref 0–149)
VLDL Cholesterol Cal: 11 mg/dL (ref 5–40)

## 2023-01-04 LAB — CBC
Hematocrit: 47.2 % (ref 37.5–51.0)
Hemoglobin: 15.7 g/dL (ref 13.0–17.7)
MCH: 28.4 pg (ref 26.6–33.0)
MCHC: 33.3 g/dL (ref 31.5–35.7)
MCV: 86 fL (ref 79–97)
Platelets: 312 10*3/uL (ref 150–450)
RBC: 5.52 x10E6/uL (ref 4.14–5.80)
RDW: 13.4 % (ref 11.6–15.4)
WBC: 7.1 10*3/uL (ref 3.4–10.8)

## 2023-01-10 ENCOUNTER — Telehealth: Payer: Self-pay | Admitting: Adult Health

## 2023-01-10 NOTE — Telephone Encounter (Signed)
I called the patient but he did not pick up I left a voicemail about scheduling his SS. I also sent a mychart message as well.  CPAP- [employer] no auth req ref # [id number] A on 01/03/23

## 2023-01-15 NOTE — Telephone Encounter (Signed)
I spoke with the patient.  CPAP- [employer] no auth req ref # [id number] A on 01/03/23   Patient is scheduled at [facility] for 01/29/23 at 9 pm.  Mailed packet to the patient.

## 2023-01-28 ENCOUNTER — Telehealth: Payer: Self-pay | Admitting: Adult Health

## 2023-01-28 NOTE — Telephone Encounter (Signed)
Pt  would like to cancel Sleep  Lab for 01/28/23. Would like a call back to reschedule.

## 2023-01-29 NOTE — Telephone Encounter (Signed)
I spoke with the patient he r/s his SS for 02/10/23 at 9 pm mailed new packet to the patient.

## 2023-02-10 ENCOUNTER — Ambulatory Visit: Payer: No Typology Code available for payment source | Admitting: Neurology

## 2023-02-10 DIAGNOSIS — G4733 Obstructive sleep apnea (adult) (pediatric): Secondary | ICD-10-CM | POA: Diagnosis not present

## 2023-02-10 DIAGNOSIS — Z9989 Dependence on other enabling machines and devices: Secondary | ICD-10-CM

## 2023-02-11 NOTE — Procedures (Unsigned)
Physician Interpretation:    Referred by: Butch Penny, NP   History and Indication for Testing: 37 year old male with an underlying medical history of reflux disease, and obesity, who presents for a full night titration study to optimize treatment of his sleep apnea. His baseline sleep study from 02/24/23 showed severe sleep disordered breathing. The baseline AHI was 113.4/h, O2 nadir 51%. He has been compliant with autoPAP of 10-20 cm with EPR, with residual AHI of over 12/hour.   TITRATION DETAILS (SEE ALSO TABLE AT THE END OF THE REPORT):  The patient was shown several different interfaces and was subsequently fitted with a *** mask from ***.  The patient was started on a pressure of 5 cm of water pressure and gradually titrated to a final titration pressure of ***.  The AHI was improved and optimized at a pressure of *** cm, on which the patient achieved a total sleep time of  ***minutes.  Residual AHI was ***, O2 nadir of ***, with ***sleep achieved.    EEG: Review of the EEG showed no abnormal electrical discharges and symmetrical bihemispheric findings.     EKG: The EKG revealed normal sinus rhythm (NSR). ***   AUDIO/VIDEO REVIEW: The audio and video review did not show any abnormal or unusual behaviors, movements, phonations or vocalizations. The patient took *** restroom breaks. Snoring was noted, ***   POST-STUDY QUESTIONNAIRE: Post study, the patient indicated, that sleep was *** the same as usual.   IMPRESSION:  1. Severe obstructive sleep apnea (OSA) 2. Dysfunctions associated with sleep stages or arousal from sleep   RECOMMENDATIONS:  1. This patient has severe obstructive sleep apnea and responded well on CPAP therapy. The patient qualified for an emergency split sleep study secondary to severe sleep disordered breathing. The baseline AHI was 113.4/h, O2 nadir 51%.  CPAP of 12 cm resulted in significant reduction of his sleep disordered breathing.  I recommend home CPAP  therapy at a pressure of 12 cm via small Vitera fullface mask with heated humidity, or mask of choice, sized to fit. The patient will be advised to be fully compliant with PAP therapy to improve sleep related symptoms and decrease long term cardiovascular risks. Please note that untreated obstructive sleep apnea may carry additional perioperative morbidity. Patients with significant obstructive sleep apnea should receive perioperative PAP therapy and the surgeons and particularly the anesthesiologist should be informed of the diagnosis and the severity of the sleep disordered breathing. 2. This study shows sleep fragmentation and abnormal sleep stage percentages; these are nonspecific findings and per se do not signify an intrinsic sleep disorder or a cause for the patient's sleep-related symptoms. Causes include (but are not limited to) the first night effect of the sleep study, circadian rhythm disturbances, medication effect or an underlying mood disorder or medical problem.  3. The patient should be cautioned not to drive, work at heights, or operate dangerous or heavy equipment when tired or sleepy. Review and reiteration of good sleep hygiene measures should be pursued with any patient. 4. The patient will be seen in follow-up in the sleep clinic at [facility] for discussion of the test results, symptom and treatment compliance review, further management strategies, etc. The referring provider will be notified of the test results.  I certify that I have reviewed the entire raw data recording prior to the issuance of this report in accordance with the Standards of Accreditation of the American Academy of Sleep Medicine (AASM). Huston Foley, MD, PhD Medical Director, [facility]  [facility]  Diplomat, ABPN (Neurology and Sleep)   Technical Report:   ***  Titration Table:  ***

## 2023-02-18 ENCOUNTER — Telehealth: Payer: Self-pay | Admitting: *Deleted

## 2023-02-18 DIAGNOSIS — G4733 Obstructive sleep apnea (adult) (pediatric): Secondary | ICD-10-CM

## 2023-02-18 NOTE — Telephone Encounter (Signed)
Called pt & LVM (ok per DPR) with office number and hours asking for call back to discuss sleep study results.

## 2023-02-18 NOTE — Telephone Encounter (Signed)
-----   Message from [facility] sent at 02/18/2023 12:27 PM EST ----- recommend changing his home treatment modality from autoPAP therapy to BiPAP of 20/16 cm via small Simplus FFM from [facility]. Please call patient and see if amenable. Then can place order

## 2023-02-21 NOTE — Telephone Encounter (Signed)
Pt returned phone call, would like a call back.  

## 2023-02-21 NOTE — Telephone Encounter (Signed)
 Zott, Linnell Fulling, Otilio Jefferson, RN; [place], [place] Got It Thank  You

## 2023-02-21 NOTE — Addendum Note (Signed)
Addended by: Bertram Savin on: 02/21/2023 02:59 PM   Modules accepted: Orders

## 2023-02-21 NOTE — Telephone Encounter (Signed)
I spoke with the patient and discussed his sleep study results.  The patient is amenable to proceeding with BiPAP.  His questions were answered.  We discussed the insurance compliance requirements which includes using the machine at least 4 hours at night and also being seen between 60 and 90 days after set up on BiPAP.  He will watch for a call from [facility] to discuss the next steps.  He does have a cost concern which he will discuss with them.  I changed his next appointment to May 27, 2023 at 11:30 AM for the initial follow-up.  He verbalized appreciation for the call.  Bipap ordered and sent to [facility].

## 2023-05-22 ENCOUNTER — Emergency Department (HOSPITAL_COMMUNITY)
Admission: EM | Admit: 2023-05-22 | Discharge: 2023-05-23 | Discharge status: Against Medical Advice | Attending: Emergency Medicine | Admitting: Emergency Medicine

## 2023-05-22 ENCOUNTER — Encounter (HOSPITAL_COMMUNITY): Payer: Self-pay

## 2023-05-22 DIAGNOSIS — Z5321 Procedure and treatment not carried out due to patient leaving prior to being seen by health care provider: Secondary | ICD-10-CM | POA: Diagnosis not present

## 2023-05-22 DIAGNOSIS — K625 Hemorrhage of anus and rectum: Secondary | ICD-10-CM | POA: Diagnosis present

## 2023-05-22 LAB — CBC
HCT: 45.3 % (ref 39.0–52.0)
Hemoglobin: 15.4 g/dL (ref 13.0–17.0)
MCH: 28.5 pg (ref 26.0–34.0)
MCHC: 34 g/dL (ref 30.0–36.0)
MCV: 83.9 fL (ref 80.0–100.0)
Platelets: 339 10*3/uL (ref 150–400)
RBC: 5.4 MIL/uL (ref 4.22–5.81)
RDW: 13.2 % (ref 11.5–15.5)
WBC: 10.1 10*3/uL (ref 4.0–10.5)
nRBC: 0 % (ref 0.0–0.2)

## 2023-05-22 LAB — TYPE AND SCREEN
ABO/RH(D): A POS
Antibody Screen: NEGATIVE

## 2023-05-22 LAB — COMPREHENSIVE METABOLIC PANEL
ALT: 20 U/L (ref 0–44)
AST: 19 U/L (ref 15–41)
Albumin: 4 g/dL (ref 3.5–5.0)
Alkaline Phosphatase: 53 U/L (ref 38–126)
Anion gap: 12 (ref 5–15)
BUN: 12 mg/dL (ref 6–20)
CO2: 25 mmol/L (ref 22–32)
Calcium: 9.7 mg/dL (ref 8.9–10.3)
Chloride: 103 mmol/L (ref 98–111)
Creatinine, Ser: 1.08 mg/dL (ref 0.61–1.24)
GFR, Estimated: >60 mL/min (ref >60)
Glucose, Bld: 85 mg/dL (ref 70–99)
Potassium: 3.7 mmol/L (ref 3.5–5.1)
Sodium: 140 mmol/L (ref 135–145)
Total Bilirubin: 0.3 mg/dL (ref 0.0–1.2)
Total Protein: 7 g/dL (ref 6.5–8.1)

## 2023-05-22 NOTE — ED Triage Notes (Signed)
 Pt c/o intermittent rectal bleeding increasing this afternoon.  Pt reports passing a moderate amount of blood.  Pt sts "it was a mix of bright and dark red."    Pt sts "my doctors think it might be hemorrhoids."

## 2023-05-22 NOTE — ED Provider Triage Note (Signed)
 Emergency Medicine Provider Triage Evaluation Note  Manuel Glover. , a 38 y.o. male  was evaluated in triage.  Pt complains of rectal bleeding.  States this has been intermittent for the past 4 years.  States there was a significant amount of bleeding this evening.  This was concerning to him.  He denies abdominal pain.  No fevers or chills.  No nausea or vomiting. no history of colonoscopy..  Review of Systems  Positive: As above Negative: As above  Physical Exam  BP (!) 141/94 (BP Location: Right Arm)   Pulse 99   Temp 97.9 F (36.6 C)   Resp 18   Ht 5\' 4"  (1.626 m)   Wt 104.3 kg   SpO2 98%   BMI 39.48 kg/m  Gen:   Awake, no distress   Resp:  Normal effort  MSK:   Moves extremities without difficulty  Other:    Medical Decision Making  Medically screening exam initiated at 7:03 PM.  Appropriate orders placed.  Christiana Fuchs. was informed that the remainder of the evaluation will be completed by another provider, this initial triage assessment does not replace that evaluation, and the importance of remaining in the ED until their evaluation is complete.  Workup initiated   Mora Bellman 05/22/23 1904

## 2023-05-22 NOTE — ED Notes (Signed)
 Pt and family stated they are done waiting and they will come back or go to another hospital if needed.

## 2023-05-23 ENCOUNTER — Ambulatory Visit: Admitting: Family Medicine

## 2023-05-23 VITALS — BP 118/88 | HR 99 | Temp 97.5°F | Ht 64.0 in | Wt 239.0 lb

## 2023-05-23 DIAGNOSIS — Z6841 Body Mass Index (BMI) 40.0 and over, adult: Secondary | ICD-10-CM | POA: Diagnosis not present

## 2023-05-23 DIAGNOSIS — K625 Hemorrhage of anus and rectum: Secondary | ICD-10-CM

## 2023-05-23 NOTE — Progress Notes (Signed)
 Setup Date: 03/22/2022

## 2023-05-23 NOTE — Patient Instructions (Signed)
 Referral to GI placed. Referral to nutrition as well.  Healthy diet. Regular exercise.  Take care  Dr. Adriana Simas

## 2023-05-24 NOTE — Progress Notes (Signed)
 PATIENT: Manuel Glover. DOB: 07-31-1985  REASON FOR VISIT: follow up HISTORY FROM: patient PRIMARY NEUROLOGIST: Dr. Frances Furbish  Chief Complaint  Patient presents with   Follow-up    Rm 9, initial bipap follow up. Doing ok, no concerns. ESS 2, FSS 14.   Feels rested.       HISTORY OF PRESENT ILLNESS: Today 05/27/23:  Manuel Glover. is a 38 y.o. male with a history of OSA on BiPAP. Returns today for follow-up. Reports that new bipap is working well. Typically can only sleep for 5 hours before waking up. Doesn't go to bed until midnight. Has to be at work at [employer]. DL is below.      12/24/22: Manuel Glover. is a 38 y.o. male with a history of OSA on CPAP. Returns today for follow-up.  Reports that CPAP is working well for him.  At the last visit we did increase his pressure of 10-20.  His download is below       05/23/22: Manuel Glover. is a 38 y.o. male with a history of OSA on CPAP. Returns today for follow-up. Download shows improvement in his apnea but AHI is still elevated. He can already tell a difference in how he feels. More energy. Not falling asleep during the day. No longer having to get up during the night to urinate.  Patient states that he does feel that he could use more pressure.  DL is below.       REVIEW OF SYSTEMS: Out of a complete 14 system review of symptoms, the patient complains only of the following symptoms, and all other reviewed systems are negative.  FSS 10 ESS 2  ALLERGIES: Allergies  Allergen Reactions   Penicillins Hives and Swelling    .Marland KitchenHas patient had a PCN reaction causing immediate rash, facial/tongue/throat swelling, SOB or lightheadedness with hypotension: No Has patient had a PCN reaction causing severe rash involving mucus membranes or skin necrosis: No Has patient had a PCN reaction that required hospitalization Yes Has patient had a PCN reaction occurring within the last 10 years: No If all of the above answers are  "NO", then may proceed with Cephalosporin use.     HOME MEDICATIONS: Outpatient Medications Prior to Visit  Medication Sig Dispense Refill   clobetasol ointment (TEMOVATE) 0.05 % Apply 1 Application topically 2 (two) times daily. 30 g 0   omeprazole (PRILOSEC OTC) 20 MG tablet Take 20 mg by mouth daily. Reported on 07/20/2015     No facility-administered medications prior to visit.    PAST MEDICAL HISTORY: Past Medical History:  Diagnosis Date   GERD (gastroesophageal reflux disease)     PAST SURGICAL HISTORY: Past Surgical History:  Procedure Laterality Date   EXTERNAL EAR SURGERY      FAMILY HISTORY: Family History  Problem Relation Age of Onset   Sleep apnea Father    Cancer Maternal Uncle        accute lymphoma   Cancer Maternal Grandmother        lymphoma    SOCIAL HISTORY: Social History   Socioeconomic History   Marital status: Married    Spouse name: Not on file   Number of children: Not on file   Years of education: Not on file   Highest education level: 9th grade  Occupational History   Not on file  Tobacco Use   Smoking status: Never   Smokeless tobacco: Never  Vaping Use   Vaping status:  Never Used  Substance and Sexual Activity   Alcohol use: Yes    Alcohol/week: 2.0 standard drinks of alcohol    Types: 2 Glasses of wine per week   Drug use: No   Sexual activity: Yes    Partners: Female  Other Topics Concern   Not on file  Social History Narrative   Caffiene 1 serving daily   Lives with wife, and one child   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   Social Drivers of Health   Financial Resource Strain: Low Risk  (07/23/2022)   Overall Financial Resource Strain (CARDIA)    Difficulty of Paying Living Expenses: Not very hard  Food Insecurity: No Food Insecurity (07/23/2022)   Hunger Vital Sign    Worried About Running Out of Food in the Last Year: Never true    Ran Out of Food in the Last Year: Never true  Transportation Needs: No  Transportation Needs (07/23/2022)   PRAPARE - Administrator, Civil Service (Medical): No    Lack of Transportation (Non-Medical): No  Physical Activity: Insufficiently Active (07/23/2022)   Exercise Vital Sign    Days of Exercise per Week: 1 day    Minutes of Exercise per Session: 60 min  Stress: No Stress Concern Present (07/23/2022)   [employer] of Occupational Health - Occupational Stress Questionnaire    Feeling of Stress : Only a little  Social Connections: Moderately Integrated (07/23/2022)   Social Connection and Isolation Panel [NHANES]    Frequency of Communication with Friends and Family: More than three times a week    Frequency of Social Gatherings with Friends and Family: Once a week    Attends Religious Services: 1 to 4 times per year    Active Member of [employer] or Organizations: No    Attends Engineer, structural: Not on file    Marital Status: Married  Catering manager Violence: Not on file      PHYSICAL EXAM  Vitals:   05/27/23 1131  BP: 125/76  Pulse: 84  Weight: 241 lb 6.4 oz (109.5 kg)  Height: 5\' 4"  (1.626 m)    Body mass index is 41.44 kg/m.  Generalized: Well developed, in no acute distress  Chest: Lungs clear to auscultation bilaterally  Neurological examination  Mentation: Alert oriented to time, place, history taking. Follows all commands speech and language fluent Cranial nerve II-XII: Facial symmetry noted   DIAGNOSTIC DATA (LABS, IMAGING, TESTING) - I reviewed patient records, labs, notes, testing and imaging myself where available.  Lab Results  Component Value Date   WBC 10.1 05/22/2023   HGB 15.4 05/22/2023   HCT 45.3 05/22/2023   MCV 83.9 05/22/2023   PLT 339 05/22/2023      Component Value Date/Time   NA 140 05/22/2023 1847   NA 142 01/03/2023 0939   K 3.7 05/22/2023 1847   CL 103 05/22/2023 1847   CO2 25 05/22/2023 1847   GLUCOSE 85 05/22/2023 1847   BUN 12 05/22/2023 1847   BUN 9 01/03/2023 0939    CREATININE 1.08 05/22/2023 1847   CALCIUM 9.7 05/22/2023 1847   PROT 7.0 05/22/2023 1847   PROT 7.4 01/03/2023 0939   ALBUMIN 4.0 05/22/2023 1847   ALBUMIN 4.5 01/03/2023 0939   AST 19 05/22/2023 1847   ALT 20 05/22/2023 1847   ALKPHOS 53 05/22/2023 1847   BILITOT 0.3 05/22/2023 1847   BILITOT 0.3 01/03/2023 0939   GFRNONAA >60 05/22/2023 1847   GFRAA >60 08/25/2018  1432   Lab Results  Component Value Date   CHOL 200 (H) 01/03/2023   HDL 55 01/03/2023   LDLCALC 134 (H) 01/03/2023   TRIG 62 01/03/2023   CHOLHDL 3.6 01/03/2023     ASSESSMENT AND PLAN 38 y.o. year old male  has a past medical history of GERD (gastroesophageal reflux disease). here with:  OSA on CPAP  - CPAP compliance excellent - Residual AHI has improved but slightly elevated at 8.  - Encourage patient to use CPAP nightly and > 4 hours each night - F/U in 1 year or sooner if needed    Butch Penny, MSN, NP-C 05/24/2023, 4:05 PM [facility] [address] [place], [place] [address] [phone]

## 2023-05-26 DIAGNOSIS — K625 Hemorrhage of anus and rectum: Secondary | ICD-10-CM | POA: Insufficient documentation

## 2023-05-26 NOTE — Assessment & Plan Note (Signed)
 Suspected hemorrhoidal bleeding.  He is hemodynamically stable.  Referring to GI.  Needs colonoscopy.  Advised healthy diet.  Advised to work on weight loss.  Patient needs to ensure regular bowel movements that are easy to pass.  He has no constipation at this time.

## 2023-05-26 NOTE — Assessment & Plan Note (Signed)
-

## 2023-05-26 NOTE — Progress Notes (Signed)
 Subjective:  Patient ID: Manuel Fuchs., male    DOB: 11/21/1985  Age: 38 y.o. MRN: [id number]  CC:   Chief Complaint  Patient presents with   Rectal Bleeding    Chronic but worse yesterday , saw a lot of bright red blood in the toilet  When usually its with wiping intermittent spotting  Went to ER but waited from evening to morning w/o being seen - triage doc  Recommended pt it may be diverticulitis or internal hemerhoids     HPI:  38 year old male presents for evaluation of the above.  Patient reports that he has had intermittent blood with wiping.  Yesterday he had blood in the toilet as well as with wiping.  This is the most amount of blood that he has seen.  Went to the ER.  Had labs drawn.  Left before being seen.  Normal CBC.  Patient denies any abdominal pain.  No rectal pain.  No relieving factors.  No other associated symptoms.  No other complaints.  Patient also interested in weight loss.  Patient Active Problem List   Diagnosis Date Noted   BRBPR (bright red blood per rectum) 05/26/2023   Morbid obesity (HCC) 05/23/2023   Psoriasis 07/24/2022   OSA (obstructive sleep apnea) 12/15/2021   GERD (gastroesophageal reflux disease) 11/11/2012    Social Hx   Social History   Socioeconomic History   Marital status: Married    Spouse name: Not on file   Number of children: Not on file   Years of education: Not on file   Highest education level: 9th grade  Occupational History   Not on file  Tobacco Use   Smoking status: Never   Smokeless tobacco: Never  Vaping Use   Vaping status: Never Used  Substance and Sexual Activity   Alcohol use: Yes    Alcohol/week: 2.0 standard drinks of alcohol    Types: 2 Glasses of wine per week   Drug use: No   Sexual activity: Yes    Partners: Female  Other Topics Concern   Not on file  Social History Narrative   Caffiene 1 serving daily   Lives with wife, and one child   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    Social Drivers of Health   Financial Resource Strain: Low Risk  (07/23/2022)   Overall Financial Resource Strain (CARDIA)    Difficulty of Paying Living Expenses: Not very hard  Food Insecurity: No Food Insecurity (07/23/2022)   Hunger Vital Sign    Worried About Running Out of Food in the Last Year: Never true    Ran Out of Food in the Last Year: Never true  Transportation Needs: No Transportation Needs (07/23/2022)   PRAPARE - Administrator, Civil Service (Medical): No    Lack of Transportation (Non-Medical): No  Physical Activity: Insufficiently Active (07/23/2022)   Exercise Vital Sign    Days of Exercise per Week: 1 day    Minutes of Exercise per Session: 60 min  Stress: No Stress Concern Present (07/23/2022)   [employer] of Occupational Health - Occupational Stress Questionnaire    Feeling of Stress : Only a little  Social Connections: Moderately Integrated (07/23/2022)   Social Connection and Isolation Panel [NHANES]    Frequency of Communication with Friends and Family: More than three times a week    Frequency of Social Gatherings with Friends and Family: Once a week    Attends Religious Services: 1 to 4 times  per year    Active Member of Clubs or Organizations: No    Attends Engineer, structural: Not on file    Marital Status: Married    Review of Systems Per HPI  Objective:  BP 118/88   Pulse 99   Temp (!) 97.5 F (36.4 C)   Ht 5\' 4"  (1.626 m)   Wt 239 lb (108.4 kg)   SpO2 97%   BMI 41.02 kg/m      05/23/2023    1:45 PM 05/22/2023    6:45 PM 05/22/2023    6:40 PM  BP/Weight  Systolic BP 118  474  Diastolic BP 88  94  Wt. (Lbs) 239 230   BMI 41.02 kg/m2 39.48 kg/m2     Physical Exam Vitals and nursing note reviewed.  Constitutional:      Appearance: Normal appearance. He is obese.  HENT:     Head: Normocephalic and atraumatic.  Cardiovascular:     Rate and Rhythm: Normal rate and regular rhythm.  Pulmonary:     Effort: Pulmonary  effort is normal.     Breath sounds: Normal breath sounds.  Neurological:     Mental Status: He is alert.  Psychiatric:        Mood and Affect: Mood normal.        Behavior: Behavior normal.     Lab Results  Component Value Date   WBC 10.1 05/22/2023   HGB 15.4 05/22/2023   HCT 45.3 05/22/2023   PLT 339 05/22/2023   GLUCOSE 85 05/22/2023   CHOL 200 (H) 01/03/2023   TRIG 62 01/03/2023   HDL 55 01/03/2023   LDLCALC 134 (H) 01/03/2023   ALT 20 05/22/2023   AST 19 05/22/2023   NA 140 05/22/2023   K 3.7 05/22/2023   CL 103 05/22/2023   CREATININE 1.08 05/22/2023   BUN 12 05/22/2023   CO2 25 05/22/2023     Assessment & Plan:  BRBPR (bright red blood per rectum) Assessment & Plan: Suspected hemorrhoidal bleeding.  He is hemodynamically stable.  Referring to GI.  Needs colonoscopy.  Advised healthy diet.  Advised to work on weight loss.  Patient needs to ensure regular bowel movements that are easy to pass.  He has no constipation at this time.  Orders: -     Ambulatory referral to Gastroenterology  Morbid obesity [facility]) Assessment & Plan: Referring to nutrition.  Orders: -     Amb ref to Medical Nutrition Therapy-MNT    Follow-up:  Return if symptoms worsen or fail to improve.  Everlene Other DO [facility]

## 2023-05-27 ENCOUNTER — Ambulatory Visit (INDEPENDENT_AMBULATORY_CARE_PROVIDER_SITE_OTHER): Payer: No Typology Code available for payment source | Admitting: Adult Health

## 2023-05-27 ENCOUNTER — Encounter: Payer: Self-pay | Admitting: Adult Health

## 2023-05-27 VITALS — BP 125/76 | HR 84 | Ht 64.0 in | Wt 241.4 lb

## 2023-05-27 DIAGNOSIS — G4733 Obstructive sleep apnea (adult) (pediatric): Secondary | ICD-10-CM | POA: Diagnosis not present

## 2023-05-27 NOTE — Patient Instructions (Signed)
Continue using BiPAP nightly and greater than 4 hours each night If your symptoms worsen or you develop new symptoms please let us know.    

## 2023-05-29 ENCOUNTER — Encounter: Payer: Self-pay | Admitting: *Deleted

## 2023-06-05 ENCOUNTER — Ambulatory Visit (INDEPENDENT_AMBULATORY_CARE_PROVIDER_SITE_OTHER): Admitting: Internal Medicine

## 2023-06-05 ENCOUNTER — Encounter: Payer: Self-pay | Admitting: Internal Medicine

## 2023-06-05 VITALS — BP 121/71 | HR 88 | Temp 98.5°F | Ht 64.0 in | Wt 241.5 lb

## 2023-06-05 DIAGNOSIS — K219 Gastro-esophageal reflux disease without esophagitis: Secondary | ICD-10-CM | POA: Diagnosis not present

## 2023-06-05 DIAGNOSIS — K625 Hemorrhage of anus and rectum: Secondary | ICD-10-CM

## 2023-06-05 DIAGNOSIS — L290 Pruritus ani: Secondary | ICD-10-CM

## 2023-06-05 NOTE — Patient Instructions (Signed)
 We will schedule you for colonoscopy to further evaluate your rectal bleeding.  Continue omeprazole daily for your reflux.  It was very nice meeting you today.  Dr. Marletta Lor

## 2023-06-05 NOTE — Progress Notes (Signed)
 Primary Care Physician:  Tommie Sams, DO Primary Gastroenterologist:  Dr. Marletta Lor  Chief Complaint  Patient presents with   Rectal Bleeding    Pt referred by PCP for rectal bleeding. Pt says has been going on for about 4 years. Pt says is just spotty on toilet paper    HPI:   Manuel Glover. is a 38 y.o. male who presents to clinic today by referral from his PCP Dr. Adriana Simas for evaluation.  Patient reports intermittent rectal bleeding off and on for nearly 4 years.  Typically small amount of bright red blood on the tissue paper.  However, recently had an episode of large amount of blood in the toilet bowl, bleeding lasted approximately 15 minutes.  Denies any rectal pain, occasional itching.  No abdominal pain.  No chronic diarrhea or constipation.  No previous colonoscopy.  No family history of colorectal latency.  After this episode he did proceed to the ER, CBC normal, left before being evaluated.  Patient had a CT abdomen pelvis with contrast 10/03/2014 which I personally reviewed, showed epiploic appendagitis involving the sigmoid colon, otherwise unremarkable from a GI standpoint  Also has chronic GERD which is well-controlled on omeprazole daily.  No dysphagia odynophagia.  No epigastric or chest pain.  Past Medical History:  Diagnosis Date   GERD (gastroesophageal reflux disease)     Past Surgical History:  Procedure Laterality Date   EXTERNAL EAR SURGERY      Current Outpatient Medications  Medication Sig Dispense Refill   clobetasol ointment (TEMOVATE) 0.05 % Apply 1 Application topically 2 (two) times daily. 30 g 0   omeprazole (PRILOSEC OTC) 20 MG tablet Take 20 mg by mouth daily. Reported on 07/20/2015     No current facility-administered medications for this visit.    Allergies as of 06/05/2023 - Review Complete 06/05/2023  Allergen Reaction Noted   Penicillins Hives and Swelling 11/07/2012    Family History  Problem Relation Age of Onset   Sleep apnea  Father    Cancer Maternal Uncle        accute lymphoma   Cancer Maternal Grandmother        lymphoma    Social History   Socioeconomic History   Marital status: Married    Spouse name: Not on file   Number of children: Not on file   Years of education: Not on file   Highest education level: 9th grade  Occupational History   Not on file  Tobacco Use   Smoking status: Never   Smokeless tobacco: Never  Vaping Use   Vaping status: Never Used  Substance and Sexual Activity   Alcohol use: Yes    Alcohol/week: 2.0 standard drinks of alcohol    Types: 2 Glasses of wine per week   Drug use: No   Sexual activity: Yes    Partners: Female  Other Topics Concern   Not on file  Social History Narrative   Caffiene 1 serving daily   Lives with wife, and one child   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   Social Drivers of Health   Financial Resource Strain: Low Risk  (07/23/2022)   Overall Financial Resource Strain (CARDIA)    Difficulty of Paying Living Expenses: Not very hard  Food Insecurity: No Food Insecurity (07/23/2022)   Hunger Vital Sign    Worried About Running Out of Food in the Last Year: Never true    Ran Out of Food in the Last Year:  Never true  Transportation Needs: No Transportation Needs (07/23/2022)   PRAPARE - Administrator, Civil Service (Medical): No    Lack of Transportation (Non-Medical): No  Physical Activity: Insufficiently Active (07/23/2022)   Exercise Vital Sign    Days of Exercise per Week: 1 day    Minutes of Exercise per Session: 60 min  Stress: No Stress Concern Present (07/23/2022)   [employer] of Occupational Health - Occupational Stress Questionnaire    Feeling of Stress : Only a little  Social Connections: Moderately Integrated (07/23/2022)   Social Connection and Isolation Panel [NHANES]    Frequency of Communication with Friends and Family: More than three times a week    Frequency of Social Gatherings with Friends and Family:  Once a week    Attends Religious Services: 1 to 4 times per year    Active Member of [employer] or Organizations: No    Attends Engineer, structural: Not on file    Marital Status: Married  Catering manager Violence: Not on file    Subjective: Review of Systems  Constitutional:  Negative for chills and fever.  HENT:  Negative for congestion and hearing loss.   Eyes:  Negative for blurred vision and double vision.  Respiratory:  Negative for cough and shortness of breath.   Cardiovascular:  Negative for chest pain and palpitations.  Gastrointestinal:  Negative for abdominal pain, blood in stool, constipation, diarrhea, heartburn, melena and vomiting.       Rectal bleeding  Genitourinary:  Negative for dysuria and urgency.  Musculoskeletal:  Negative for joint pain and myalgias.  Skin:  Negative for itching and rash.  Neurological:  Negative for dizziness and headaches.  Psychiatric/Behavioral:  Negative for depression. The patient is not nervous/anxious.        Objective: BP 121/71 (BP Location: Right Arm, Patient Position: Sitting, Cuff Size: Large)   Pulse 88   Temp 98.5 F (36.9 C) (Oral)   Ht 5\' 4"  (1.626 m)   Wt 241 lb 8 oz (109.5 kg)   BMI 41.45 kg/m  Physical Exam Constitutional:      Appearance: Normal appearance.  HENT:     Head: Normocephalic and atraumatic.  Eyes:     Extraocular Movements: Extraocular movements intact.     Conjunctiva/sclera: Conjunctivae normal.  Cardiovascular:     Rate and Rhythm: Normal rate and regular rhythm.  Pulmonary:     Effort: Pulmonary effort is normal.     Breath sounds: Normal breath sounds.  Abdominal:     General: Bowel sounds are normal.     Palpations: Abdomen is soft.  Musculoskeletal:        General: Normal range of motion.     Cervical back: Normal range of motion and neck supple.  Skin:    General: Skin is warm.  Neurological:     General: No focal deficit present.     Mental Status: He is alert and  oriented to person, place, and time.  Psychiatric:        Mood and Affect: Mood normal.        Behavior: Behavior normal.   Rectal exam deferred by patient until time of colonoscopy   Assessment/Plan:  1.  Rectal bleeding-likely benign anorectal source such as hemorrhoids though given chronicity and recent episode large amount of bleeding, will schedule for colonoscopy to further evaluate.  The risks including infection, bleed, or perforation as well as benefits, limitations, alternatives and imponderables have been reviewed with the  patient. Questions have been answered. All parties agreeable.  Discussed hemorrhoid banding with patient today as well.  2.  Chronic GERD-well-controlled Meprazole daily.  Will continue.  Thank you Dr. Adriana Simas for the kind referral.  06/05/2023 9:23 AM   Disclaimer: This note was dictated with voice recognition software. Similar sounding words can inadvertently be transcribed and may not be corrected upon review.

## 2023-06-05 NOTE — H&P (View-Only) (Signed)
 Primary Care Physician:  Tommie Sams, DO Primary Gastroenterologist:  Dr. Marletta Lor  Chief Complaint  Patient presents with   Rectal Bleeding    Pt referred by PCP for rectal bleeding. Pt says has been going on for about 4 years. Pt says is just spotty on toilet paper    HPI:   Manuel Glover. is a 38 y.o. male who presents to clinic today by referral from his PCP Dr. Adriana Simas for evaluation.  Patient reports intermittent rectal bleeding off and on for nearly 4 years.  Typically small amount of bright red blood on the tissue paper.  However, recently had an episode of large amount of blood in the toilet bowl, bleeding lasted approximately 15 minutes.  Denies any rectal pain, occasional itching.  No abdominal pain.  No chronic diarrhea or constipation.  No previous colonoscopy.  No family history of colorectal latency.  After this episode he did proceed to the ER, CBC normal, left before being evaluated.  Patient had a CT abdomen pelvis with contrast 10/03/2014 which I personally reviewed, showed epiploic appendagitis involving the sigmoid colon, otherwise unremarkable from a GI standpoint  Also has chronic GERD which is well-controlled on omeprazole daily.  No dysphagia odynophagia.  No epigastric or chest pain.  Past Medical History:  Diagnosis Date   GERD (gastroesophageal reflux disease)     Past Surgical History:  Procedure Laterality Date   EXTERNAL EAR SURGERY      Current Outpatient Medications  Medication Sig Dispense Refill   clobetasol ointment (TEMOVATE) 0.05 % Apply 1 Application topically 2 (two) times daily. 30 g 0   omeprazole (PRILOSEC OTC) 20 MG tablet Take 20 mg by mouth daily. Reported on 07/20/2015     No current facility-administered medications for this visit.    Allergies as of 06/05/2023 - Review Complete 06/05/2023  Allergen Reaction Noted   Penicillins Hives and Swelling 11/07/2012    Family History  Problem Relation Age of Onset   Sleep apnea  Father    Cancer Maternal Uncle        accute lymphoma   Cancer Maternal Grandmother        lymphoma    Social History   Socioeconomic History   Marital status: Married    Spouse name: Not on file   Number of children: Not on file   Years of education: Not on file   Highest education level: 9th grade  Occupational History   Not on file  Tobacco Use   Smoking status: Never   Smokeless tobacco: Never  Vaping Use   Vaping status: Never Used  Substance and Sexual Activity   Alcohol use: Yes    Alcohol/week: 2.0 standard drinks of alcohol    Types: 2 Glasses of wine per week   Drug use: No   Sexual activity: Yes    Partners: Female  Other Topics Concern   Not on file  Social History Narrative   Caffiene 1 serving daily   Lives with wife, and one child   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   Social Drivers of Health   Financial Resource Strain: Low Risk  (07/23/2022)   Overall Financial Resource Strain (CARDIA)    Difficulty of Paying Living Expenses: Not very hard  Food Insecurity: No Food Insecurity (07/23/2022)   Hunger Vital Sign    Worried About Running Out of Food in the Last Year: Never true    Ran Out of Food in the Last Year:  Never true  Transportation Needs: No Transportation Needs (07/23/2022)   PRAPARE - Administrator, Civil Service (Medical): No    Lack of Transportation (Non-Medical): No  Physical Activity: Insufficiently Active (07/23/2022)   Exercise Vital Sign    Days of Exercise per Week: 1 day    Minutes of Exercise per Session: 60 min  Stress: No Stress Concern Present (07/23/2022)   [employer] of Occupational Health - Occupational Stress Questionnaire    Feeling of Stress : Only a little  Social Connections: Moderately Integrated (07/23/2022)   Social Connection and Isolation Panel [NHANES]    Frequency of Communication with Friends and Family: More than three times a week    Frequency of Social Gatherings with Friends and Family:  Once a week    Attends Religious Services: 1 to 4 times per year    Active Member of [employer] or Organizations: No    Attends Engineer, structural: Not on file    Marital Status: Married  Catering manager Violence: Not on file    Subjective: Review of Systems  Constitutional:  Negative for chills and fever.  HENT:  Negative for congestion and hearing loss.   Eyes:  Negative for blurred vision and double vision.  Respiratory:  Negative for cough and shortness of breath.   Cardiovascular:  Negative for chest pain and palpitations.  Gastrointestinal:  Negative for abdominal pain, blood in stool, constipation, diarrhea, heartburn, melena and vomiting.       Rectal bleeding  Genitourinary:  Negative for dysuria and urgency.  Musculoskeletal:  Negative for joint pain and myalgias.  Skin:  Negative for itching and rash.  Neurological:  Negative for dizziness and headaches.  Psychiatric/Behavioral:  Negative for depression. The patient is not nervous/anxious.        Objective: BP 121/71 (BP Location: Right Arm, Patient Position: Sitting, Cuff Size: Large)   Pulse 88   Temp 98.5 F (36.9 C) (Oral)   Ht 5\' 4"  (1.626 m)   Wt 241 lb 8 oz (109.5 kg)   BMI 41.45 kg/m  Physical Exam Constitutional:      Appearance: Normal appearance.  HENT:     Head: Normocephalic and atraumatic.  Eyes:     Extraocular Movements: Extraocular movements intact.     Conjunctiva/sclera: Conjunctivae normal.  Cardiovascular:     Rate and Rhythm: Normal rate and regular rhythm.  Pulmonary:     Effort: Pulmonary effort is normal.     Breath sounds: Normal breath sounds.  Abdominal:     General: Bowel sounds are normal.     Palpations: Abdomen is soft.  Musculoskeletal:        General: Normal range of motion.     Cervical back: Normal range of motion and neck supple.  Skin:    General: Skin is warm.  Neurological:     General: No focal deficit present.     Mental Status: He is alert and  oriented to person, place, and time.  Psychiatric:        Mood and Affect: Mood normal.        Behavior: Behavior normal.   Rectal exam deferred by patient until time of colonoscopy   Assessment/Plan:  1.  Rectal bleeding-likely benign anorectal source such as hemorrhoids though given chronicity and recent episode large amount of bleeding, will schedule for colonoscopy to further evaluate.  The risks including infection, bleed, or perforation as well as benefits, limitations, alternatives and imponderables have been reviewed with the  patient. Questions have been answered. All parties agreeable.  Discussed hemorrhoid banding with patient today as well.  2.  Chronic GERD-well-controlled Meprazole daily.  Will continue.  Thank you Dr. Adriana Simas for the kind referral.  06/05/2023 9:23 AM   Disclaimer: This note was dictated with voice recognition software. Similar sounding words can inadvertently be transcribed and may not be corrected upon review.

## 2023-06-06 ENCOUNTER — Other Ambulatory Visit: Payer: Self-pay | Admitting: *Deleted

## 2023-06-06 ENCOUNTER — Encounter: Payer: Self-pay | Admitting: *Deleted

## 2023-06-06 MED ORDER — PEG 3350-KCL-NA BICARB-NACL 420 G PO SOLR: 4000.0000 mL | Freq: Once | ORAL | 0 refills | Status: AC

## 2023-06-10 ENCOUNTER — Telehealth: Payer: Self-pay | Admitting: *Deleted

## 2023-06-10 ENCOUNTER — Encounter: Payer: Self-pay | Admitting: *Deleted

## 2023-06-10 NOTE — Telephone Encounter (Signed)
 Pt called and needed to reschedule his procedure. He has been rescheduled for 07/01/23 at 2 pm.  Updated instructions sent via mychart.

## 2023-06-11 ENCOUNTER — Encounter: Payer: Self-pay | Admitting: *Deleted

## 2023-06-26 ENCOUNTER — Encounter: Attending: Family Medicine | Admitting: Nutrition

## 2023-06-26 NOTE — Progress Notes (Signed)
 Medical Nutrition Therapy  Appointment Start time:  0800  Appointment End time:  0915  Primary concerns today: Obesity  Referral diagnosis: E66.9 Preferred learning style: Hear and see  Learning readiness: In progress.   (not ready, contemplating, ready, change in progress)   NUTRITION ASSESSMENT  38 yr old male referred for obesity. BMI 40. Has lost about 4 lbs in the last month. Elevated LDL at 134 mg/dl. Drinks sodas. He is willing to commit to Lifestyle Medicine with a whole plant based diet to lose weight and improve his overall health.   Clinical Medical Hx:  Past Medical History:  Diagnosis Date   GERD (gastroesophageal reflux disease)     Medications:  Current Outpatient Medications on File Prior to Visit  Medication Sig Dispense Refill   omeprazole (PRILOSEC OTC) 20 MG tablet Take 20 mg by mouth daily. Reported on 07/20/2015     clobetasol  ointment (TEMOVATE ) 0.05 % Apply 1 Application topically 2 (two) times daily. (Patient not taking: Reported on 06/26/2023) 30 g 0   No current facility-administered medications on file prior to visit.    Labs: Lipid Panel     Component Value Date/Time   CHOL 200 (H) 01/03/2023 0939   TRIG 62 01/03/2023 0939   HDL 55 01/03/2023 0939   CHOLHDL 3.6 01/03/2023 0939   LDLCALC 134 (H) 01/03/2023 0939   LABVLDL 11 01/03/2023 0939  \    Latest Ref Rng & Units 05/22/2023    6:47 PM 01/03/2023    9:39 AM 12/15/2021    2:39 PM  CMP  Glucose 70 - 99 mg/dL 85  161  83   BUN 6 - 20 mg/dL 12  9  10    Creatinine 0.61 - 1.24 mg/dL 0.96  0.45  4.09   Sodium 135 - 145 mmol/L 140  142  138   Potassium 3.5 - 5.1 mmol/L 3.7  4.9  4.1   Chloride 98 - 111 mmol/L 103  105  99   CO2 22 - 32 mmol/L 25  23  23    Calcium 8.9 - 10.3 mg/dL 9.7  9.5  9.1   Total Protein 6.5 - 8.1 g/dL 7.0  7.4  7.4   Total Bilirubin 0.0 - 1.2 mg/dL 0.3  0.3  <8.1   Alkaline Phos 38 - 126 U/L 53  72  71   AST 15 - 41 U/L 19  17  17    ALT 0 - 44 U/L 20  20  20       Notable Signs/Symptoms: none  Lifestyle & Dietary Hx Married and lives with family. He cooks and shops.  Estimated daily fluid intake: 30-45  oz Supplements: none Sleep: 5-6  Stress / self-care:  Current average weekly physical activity:   24-Hr Dietary Recall Eats 2-3 meals per day.   Estimated Energy Needs Calories: 1500 Carbohydrate: 170g Protein: 112g Fat: 42g   NUTRITION DIAGNOSIS  NI-1.7 Predicted excessive energy intake As related to excesscive calorie intake .  As evidenced by BMI 40.   NUTRITION INTERVENTION  Nutrition education (E-1) on the following topics:  Nutrition and  risk for Diabetes education provided on My Plate, CHO counting, meal planning, portion sizes, timing of meals, avoiding snacks between meals unless having a low blood sugar, target ranges for A1C and blood sugars, signs/symptoms and , taking medications as prescribed, benefits of exercising 30 minutes per day and prevention of DM. Lifestyle Medicine  - Whole Food, Plant Predominant Nutrition is highly recommended: Eat Plenty of vegetables,  Mushrooms, fruits, Legumes, Whole Grains, Nuts, seeds in lieu of processed meats, processed snacks/pastries red meat, poultry, eggs.    -It is better to avoid simple carbohydrates including: Cakes, Sweet Desserts, Ice Cream, Soda (diet and regular), Sweet Tea, Candies, Chips, Cookies, Store Bought Juices, Alcohol in Excess of  1-2 drinks a day, Lemonade,  Artificial Sweeteners, Doughnuts, Coffee Creamers, "Sugar-free" Products, etc, etc.  This is not a complete list.....  Exercise: If you are able: 30 -60 minutes a day ,4 days a week, or 150 minutes a week.  The longer the better.  Combine stretch, strength, and aerobic activities.  If you were told in the past that you have high risk for cardiovascular diseases, you may seek evaluation by your heart doctor prior to initiating moderate to intense exercise programs.   Handouts Provided Include  Lifestyle  Medicine handouts Weight loss tips  Learning Style & Readiness for Change Teaching method utilized: Visual & Auditory  Demonstrated degree of understanding via: Teach Back  Barriers to learning/adherence to lifestyle change: none  Goals Established by Pt Goals Focus on more fresh fruits, vegetables and whole grains Cut out soda; reduce to 1 every other day in the mean time. Drink 4 bottles of water  per day. Start walk 30 minutes 5 days per week in the evenings. Work on [employer] and meal planning. Lose 1 lb per week. Watch Pharmacist, community and [employer].   MONITORING & EVALUATION Dietary intake, weekly physical activity, and weight in 1 month.  Next Steps  Patient is to work on better meal planning and meal prepping.Aaron Aas

## 2023-06-26 NOTE — Patient Instructions (Addendum)
 Goals Focus on more fresh fruits, vegetables and whole grains Cut out soda; reduce to 1 every other day in the mean time. Drink 4 bottles of water per day. Start walk 30 minutes 5 days per week in the evenings. Work on [employer] and meal planning. Lose 1 lb per week. Watch Pharmacist, community and [employer].

## 2023-06-27 ENCOUNTER — Other Ambulatory Visit: Payer: Self-pay

## 2023-06-27 ENCOUNTER — Encounter (HOSPITAL_COMMUNITY)
Admission: RE | Admit: 2023-06-27 | Discharge: 2023-06-27 | Disposition: A | Discharge status: Home / Self Care | Source: Ambulatory Visit | Attending: Internal Medicine | Admitting: Internal Medicine

## 2023-06-27 ENCOUNTER — Encounter (HOSPITAL_COMMUNITY): Payer: Self-pay

## 2023-06-27 NOTE — Pre-Procedure Instructions (Signed)
 Attempted pre-op phone call. Left VM for him to call us back.

## 2023-07-01 ENCOUNTER — Ambulatory Visit (HOSPITAL_COMMUNITY)

## 2023-07-01 ENCOUNTER — Encounter (HOSPITAL_COMMUNITY): Payer: Self-pay | Admitting: Internal Medicine

## 2023-07-01 ENCOUNTER — Ambulatory Visit (HOSPITAL_COMMUNITY)
Admission: RE | Admit: 2023-07-01 | Discharge: 2023-07-01 | Disposition: A | Discharge status: Home / Self Care | Attending: Internal Medicine | Admitting: Internal Medicine

## 2023-07-01 ENCOUNTER — Encounter (HOSPITAL_COMMUNITY)
Admission: RE | Disposition: A | Discharge status: Home / Self Care | Payer: Self-pay | Source: Home / Self Care | Attending: Internal Medicine

## 2023-07-01 DIAGNOSIS — K219 Gastro-esophageal reflux disease without esophagitis: Secondary | ICD-10-CM | POA: Diagnosis not present

## 2023-07-01 DIAGNOSIS — K625 Hemorrhage of anus and rectum: Secondary | ICD-10-CM | POA: Diagnosis present

## 2023-07-01 DIAGNOSIS — Z79899 Other long term (current) drug therapy: Secondary | ICD-10-CM | POA: Insufficient documentation

## 2023-07-01 DIAGNOSIS — G4730 Sleep apnea, unspecified: Secondary | ICD-10-CM | POA: Insufficient documentation

## 2023-07-01 DIAGNOSIS — K648 Other hemorrhoids: Secondary | ICD-10-CM

## 2023-07-01 HISTORY — PX: COLONOSCOPY: SHX5424

## 2023-07-01 SURGERY — COLONOSCOPY
Anesthesia: General

## 2023-07-01 MED ORDER — LIDOCAINE HCL (PF) 2 % IJ SOLN
INTRAMUSCULAR | Status: DC | PRN
  Administered 2023-07-01: 60 mg via INTRADERMAL

## 2023-07-01 MED ORDER — PROPOFOL 10 MG/ML IV BOLUS
INTRAVENOUS | Status: DC | PRN
  Administered 2023-07-01: 70 mg via INTRAVENOUS
  Administered 2023-07-01 (×2): 30 mg via INTRAVENOUS

## 2023-07-01 MED ORDER — PHENYLEPHRINE 80 MCG/ML (10ML) SYRINGE FOR IV PUSH (FOR BLOOD PRESSURE SUPPORT)
PREFILLED_SYRINGE | INTRAVENOUS | Status: AC
  Filled 2023-07-01: qty 10

## 2023-07-01 MED ORDER — LACTATED RINGERS IV SOLN: INTRAVENOUS | Status: DC | PRN

## 2023-07-01 MED ORDER — LIDOCAINE HCL (PF) 2 % IJ SOLN
INTRAMUSCULAR | Status: AC
  Filled 2023-07-01: qty 5

## 2023-07-01 MED ORDER — PROPOFOL 500 MG/50ML IV EMUL
INTRAVENOUS | Status: AC
  Filled 2023-07-01: qty 50

## 2023-07-01 MED ORDER — PROPOFOL 500 MG/50ML IV EMUL
INTRAVENOUS | Status: DC | PRN
  Administered 2023-07-01: 150 ug/kg/min via INTRAVENOUS

## 2023-07-01 MED ORDER — STERILE WATER FOR IRRIGATION IR SOLN
Status: DC | PRN
  Administered 2023-07-01: 60 mL

## 2023-07-01 MED ORDER — PROPOFOL 10 MG/ML IV BOLUS
INTRAVENOUS | Status: AC
  Filled 2023-07-01: qty 20

## 2023-07-01 NOTE — Anesthesia Preprocedure Evaluation (Addendum)
 Anesthesia Evaluation  Patient identified by MRN, date of birth, ID band Patient awake    Airway Mallampati: III  TM Distance: <3 FB Neck ROM: Full  Mouth opening: Limited Mouth Opening  Dental  (+) Missing   Pulmonary sleep apnea    Pulmonary exam normal        Cardiovascular Exercise Tolerance: Good negative cardio ROS Normal cardiovascular exam Rhythm:Regular Rate:Normal     Neuro/Psych negative neurological ROS  negative psych ROS   GI/Hepatic Neg liver ROS,GERD  ,,  Endo/Other  negative endocrine ROS    Renal/GU negative Renal ROS     Musculoskeletal   Abdominal  (+) + obese  Peds  Hematology negative hematology ROS (+)   Anesthesia Other Findings   Reproductive/Obstetrics                             Anesthesia Physical Anesthesia Plan  ASA: 2  Anesthesia Plan: General   Post-op Pain Management:    Induction: Intravenous  PONV Risk Score and Plan: 0 and Propofol infusion  Airway Management Planned: Nasal Cannula  Additional Equipment:   Intra-op Plan:   Post-operative Plan:   Informed Consent: I have reviewed the patients History and Physical, chart, labs and discussed the procedure including the risks, benefits and alternatives for the proposed anesthesia with the patient or authorized representative who has indicated his/her understanding and acceptance.       Plan Discussed with: CRNA and Surgeon  Anesthesia Plan Comments:        Anesthesia Quick Evaluation

## 2023-07-01 NOTE — Interval H&P Note (Signed)
 History and Physical Interval Note:  07/01/2023 1:14 PM  Manuel Glover Nolberto Batty.  has presented today for surgery, with the diagnosis of rectal bleeding.  The various methods of treatment have been discussed with the patient and family. After consideration of risks, benefits and other options for treatment, the patient has consented to  Procedure(s) with comments: COLONOSCOPY (N/A) - 8:30 am, asa 2 as a surgical intervention.  The patient's history has been reviewed, patient examined, no change in status, stable for surgery.  I have reviewed the patient's chart and labs.  Questions were answered to the patient's satisfaction.     Vinetta Greening

## 2023-07-01 NOTE — Transfer of Care (Addendum)
 Immediate Anesthesia Transfer of Care Note  Patient: Manuel Glover.  Procedure(s) Performed: COLONOSCOPY  Patient Location: Short Stay  Anesthesia Type:General  Level of Consciousness: drowsy and patient cooperative  Airway & Oxygen Therapy: Patient Spontanous Breathing and Patient connected to nasal cannula oxygen  Post-op Assessment: Report given to RN and Post -op Vital signs reviewed and stable  Post vital signs: Reviewed and stable  Last Vitals:  Vitals Value Taken Time  BP 109/60 07/01/23   1413  Temp 36.7 07/01/23   1413  Pulse 107 07/01/23 1414  Resp 19 07/01/23 1414  SpO2 99 % 07/01/23 1414  Vitals shown include unfiled device data.  Last Pain:  Vitals:   07/01/23 1352  PainSc: 0-No pain         Complications: No notable events documented.

## 2023-07-01 NOTE — Op Note (Signed)
 [facility] Patient Name: Manuel Glover Procedure Date: 07/01/2023 1:42 PM MRN: [id number] Date of Birth: June 15, 1985 Attending MD: Hennie Duos. Marletta Lor , [place], [id number] CSN: [id number] Age: 38 Admit Type: Outpatient Procedure:                Colonoscopy Indications:              Rectal bleeding Providers:                Hennie Duos. Marletta Lor, DO, Crystal Page, [place] Wilson,                            Technician, Dyann Ruddle Referring MD:              Medicines:                See the Anesthesia note for documentation of the                            administered medications Complications:            No immediate complications. Estimated Blood Loss:     Estimated blood loss: none. Procedure:                Pre-Anesthesia Assessment:                           - The anesthesia plan was to use monitored                            anesthesia care (MAC).                           After obtaining informed consent, the colonoscope                            was passed under direct vision. Throughout the                            procedure, the patient's blood pressure, pulse, and                            oxygen saturations were monitored continuously. The                            [id number]-HQ190L ([id number]) scope was introduced through                            the anus and advanced to the the terminal ileum,                            with identification of the appendiceal orifice and                            IC valve. The colonoscopy was performed without                            difficulty. The patient tolerated the procedure  well. The quality of the bowel preparation was                            evaluated using the BBPS [facility] Bowel Preparation                            Scale) with scores of: Right Colon = 2 (minor                            amount of residual staining, small fragments of                            stool and/or opaque liquid, but mucosa  seen well),                            Transverse Colon = 3 (entire mucosa seen well with                            no residual staining, small fragments of stool or                            opaque liquid) and Left Colon = 2 (minor amount of                            residual staining, small fragments of stool and/or                            opaque liquid, but mucosa seen well). The total                            BBPS score equals 7. The quality of the bowel                            preparation was good. Scope In: 1:56:52 PM Scope Out: 2:09:27 PM Scope Withdrawal Time: 0 hours 10 minutes 45 seconds  Total Procedure Duration: 0 hours 12 minutes 35 seconds  Findings:      Non-bleeding internal hemorrhoids were found during retroflexion.      The terminal ileum appeared normal.      The colon (entire examined portion) appeared normal. Impression:               - Non-bleeding internal hemorrhoids.                           - The examined portion of the ileum was normal.                           - The entire examined colon is normal.                           - No specimens collected. Moderate Sedation:      Per Anesthesia Care Recommendation:           - Patient has a contact number available for  emergencies. The signs and symptoms of potential                            delayed complications were discussed with the                            patient. Return to normal activities tomorrow.                            Written discharge instructions were provided to the                            patient.                           - Resume previous diet.                           - Continue present medications.                           - Repeat colonoscopy in 10 years for screening                            purposes.                           - Return to GI clinic PRN for hemorrhoid banding if                            bleeding continues. Procedure  Code(s):        --- Professional ---                           [phone], Colonoscopy, flexible; diagnostic, including                            collection of specimen(s) by brushing or washing,                            when performed (separate procedure) Diagnosis Code(s):        --- Professional ---                           K64.8, Other hemorrhoids                           K62.5, Hemorrhage of anus and rectum CPT copyright 2022 American Medical Association. All rights reserved. The codes documented in this report are preliminary and upon coder review may  be revised to meet current compliance requirements. [name]. [name], DO [name]. [name], DO 07/01/2023 2:14:11 PM This report has been signed electronically. Number of Addenda: 0

## 2023-07-01 NOTE — Discharge Instructions (Addendum)
  Colonoscopy Discharge Instructions  Read the instructions outlined below and refer to this sheet in the next few weeks. These discharge instructions provide you with general information on caring for yourself after you leave the hospital. Your doctor may also give you specific instructions. While your treatment has been planned according to the most current medical practices available, unavoidable complications occasionally occur.   ACTIVITY You may resume your regular activity, but move at a slower pace for the next 24 hours.  Take frequent rest periods for the next 24 hours.  Walking will help get rid of the air and reduce the bloated feeling in your belly (abdomen).  No driving for 24 hours (because of the medicine (anesthesia) used during the test).   Do not sign any important legal documents or operate any machinery for 24 hours (because of the anesthesia used during the test).  NUTRITION Drink plenty of fluids.  You may resume your normal diet as instructed by your doctor.  Begin with a light meal and progress to your normal diet. Heavy or fried foods are harder to digest and may make you feel sick to your stomach (nauseated).  Avoid alcoholic beverages for 24 hours or as instructed.  MEDICATIONS You may resume your normal medications unless your doctor tells you otherwise.  WHAT YOU CAN EXPECT TODAY Some feelings of bloating in the abdomen.  Passage of more gas than usual.  Spotting of blood in your stool or on the toilet paper.  IF YOU HAD POLYPS REMOVED DURING THE COLONOSCOPY: No aspirin products for 7 days or as instructed.  No alcohol for 7 days or as instructed.  Eat a soft diet for the next 24 hours.  FINDING OUT THE RESULTS OF YOUR TEST Not all test results are available during your visit. If your test results are not back during the visit, make an appointment with your caregiver to find out the results. Do not assume everything is normal if you have not heard from your  caregiver or the medical facility. It is important for you to follow up on all of your test results.  SEEK IMMEDIATE MEDICAL ATTENTION IF: You have more than a spotting of blood in your stool.  Your belly is swollen (abdominal distention).  You are nauseated or vomiting.  You have a temperature over 101.  You have abdominal pain or discomfort that is severe or gets worse throughout the day.   Your colonoscopy was relatively unremarkable.  I did not find any polyps or evidence of colon cancer.  I recommend repeating colonoscopy in 10 years for colon cancer screening purposes.   You do have internal hemorrhoids which is likely what caused your bleeding.  We can consider hemorrhoid banding in the outpatient clinic, otherwise follow-up as needed.  I hope you have a great rest of your week!  [name]. [name], D.O. Gastroenterology and Hepatology [facility]

## 2023-07-01 NOTE — Anesthesia Postprocedure Evaluation (Signed)
 Anesthesia Post Note  Patient: Manuel Glover.  Procedure(s) Performed: COLONOSCOPY  Patient location during evaluation: PACU Anesthesia Type: General Level of consciousness: awake and alert Pain management: pain level controlled Vital Signs Assessment: post-procedure vital signs reviewed and stable Respiratory status: spontaneous breathing, nonlabored ventilation, respiratory function stable and patient connected to nasal cannula oxygen Cardiovascular status: stable and blood pressure returned to baseline Postop Assessment: no apparent nausea or vomiting Anesthetic complications: no   No notable events documented.   Last Vitals:  Vitals:   07/01/23 1313 07/01/23 1413  BP: 134/72 109/60  Pulse:  (!) 105  Resp: 16 15  Temp: 36.9 C 36.7 C  SpO2: 97% 98%    Last Pain:  Vitals:   07/01/23 1413  TempSrc: Oral  PainSc: 0-No pain                 Beacher Limerick

## 2023-07-02 ENCOUNTER — Encounter (HOSPITAL_COMMUNITY): Payer: Self-pay | Admitting: Internal Medicine

## 2023-07-08 ENCOUNTER — Telehealth: Payer: Self-pay

## 2023-07-08 NOTE — Telephone Encounter (Signed)
 Need referral to ENT

## 2023-07-08 NOTE — Telephone Encounter (Signed)
 Referral for ear wax build up to ENT

## 2023-07-09 ENCOUNTER — Other Ambulatory Visit: Payer: Self-pay

## 2023-07-09 ENCOUNTER — Encounter: Payer: Self-pay | Admitting: Nutrition

## 2023-07-09 DIAGNOSIS — H6120 Impacted cerumen, unspecified ear: Secondary | ICD-10-CM

## 2023-07-16 ENCOUNTER — Ambulatory Visit: Admitting: Family Medicine

## 2023-07-16 VITALS — BP 118/79 | HR 88 | Temp 98.6°F | Ht 64.0 in | Wt 232.8 lb

## 2023-07-16 DIAGNOSIS — R21 Rash and other nonspecific skin eruption: Secondary | ICD-10-CM | POA: Diagnosis not present

## 2023-07-16 MED ORDER — FLUCONAZOLE 150 MG PO TABS
150.0000 mg | ORAL_TABLET | ORAL | 0 refills | Status: AC
Start: 2023-07-16 — End: ?

## 2023-07-16 MED ORDER — CLOTRIMAZOLE-BETAMETHASONE 1-0.05 % EX CREA: 1.0000 | TOPICAL_CREAM | Freq: Every day | CUTANEOUS | 0 refills | Status: AC

## 2023-07-16 NOTE — Progress Notes (Signed)
 Subjective:  Patient ID: Manuel Glover., adult    DOB: 04-14-1985  Age: 38 y.o. MRN: [id number]  CC: Rash   HPI:  38 year old male presents for evaluation of rash.  Patient reports a few scattered areas on his upper extremities.  Patient also reports significant rash and itching to the inner thighs.  He has used over-the-counter Lotrimin and an anti-itch wash without resolution.  Patient Active Problem List   Diagnosis Date Noted   Rash 07/16/2023   Morbid obesity (HCC) 05/23/2023   Psoriasis 07/24/2022   OSA (obstructive sleep apnea) 12/15/2021   GERD (gastroesophageal reflux disease) 11/11/2012    Social Hx   Social History   Socioeconomic History   Marital status: Married    Spouse name: Not on file   Number of children: Not on file   Years of education: Not on file   Highest education level: 9th grade  Occupational History   Not on file  Tobacco Use   Smoking status: Never   Smokeless tobacco: Never  Vaping Use   Vaping status: Never Used  Substance and Sexual Activity   Alcohol use: Yes    Alcohol/week: 2.0 standard drinks of alcohol    Types: 2 Glasses of wine per week   Drug use: No   Sexual activity: Yes    Partners: Female  Other Topics Concern   Not on file  Social History Narrative   Caffiene 1 serving daily   Lives with wife, and one child   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   Social Drivers of Health   Financial Resource Strain: Low Risk  (07/23/2022)   Overall Financial Resource Strain (CARDIA)    Difficulty of Paying Living Expenses: Not very hard  Food Insecurity: No Food Insecurity (07/23/2022)   Hunger Vital Sign    Worried About Running Out of Food in the Last Year: Never true    Ran Out of Food in the Last Year: Never true  Transportation Needs: No Transportation Needs (07/23/2022)   PRAPARE - Administrator, Civil Service (Medical): No    Lack of Transportation (Non-Medical): No  Physical Activity: Insufficiently  Active (07/23/2022)   Exercise Vital Sign    Days of Exercise per Week: 1 day    Minutes of Exercise per Session: 60 min  Stress: No Stress Concern Present (07/23/2022)   [employer] of Occupational Health - Occupational Stress Questionnaire    Feeling of Stress : Only a little  Social Connections: Moderately Integrated (07/23/2022)   Social Connection and Isolation Panel [NHANES]    Frequency of Communication with Friends and Family: More than three times a week    Frequency of Social Gatherings with Friends and Family: Once a week    Attends Religious Services: 1 to 4 times per year    Active Member of [employer] or Organizations: No    Attends Engineer, structural: Not on file    Marital Status: Married    Review of Systems Per HPI  Objective:  BP 118/79   Pulse 88   Temp 98.6 F (37 C)   Ht 5\' 4"  (1.626 m)   Wt 232 lb 12.8 oz (105.6 kg)   SpO2 96%   BMI 39.96 kg/m      07/16/2023    4:13 PM 07/01/2023    2:13 PM 07/01/2023    1:13 PM  BP/Weight  Systolic BP 118 109 134  Diastolic BP 79 60 72  Wt. (Lbs) 232.8  BMI 39.96 kg/m2      Physical Exam Constitutional:      General: She is not in acute distress.    Appearance: Normal appearance. She is obese.  HENT:     Head: Normocephalic and atraumatic.  Skin:    Comments: Proximal inner thighs and inguinal region with erythematous areas that are slightly raised.  Neurological:     Mental Status: She is alert.     Lab Results  Component Value Date   WBC 10.1 05/22/2023   HGB 15.4 05/22/2023   HCT 45.3 05/22/2023   PLT 339 05/22/2023   GLUCOSE 85 05/22/2023   CHOL 200 (H) 01/03/2023   TRIG 62 01/03/2023   HDL 55 01/03/2023   LDLCALC 134 (H) 01/03/2023   ALT 20 05/22/2023   AST 19 05/22/2023   NA 140 05/22/2023   K 3.7 05/22/2023   CL 103 05/22/2023   CREATININE 1.08 05/22/2023   BUN 12 05/22/2023   CO2 25 05/22/2023     Assessment & Plan:  Rash Assessment & Plan: Appears most consistent  with tinea.  Treating with Lotrisone and Diflucan.   Other orders -     Clotrimazole-Betamethasone; Apply 1 Application topically daily.  Dispense: 45 g; Refill: 0 -     Fluconazole; Take 1 tablet (150 mg total) by mouth once a week.  Dispense: 4 tablet; Refill: 0    Follow-up:  Return if symptoms worsen or fail to improve.  Kathleen Papa DO [facility]

## 2023-07-16 NOTE — Patient Instructions (Signed)
 Medications as prescribed.  If continues to persist, please let us  know.

## 2023-07-16 NOTE — Assessment & Plan Note (Signed)
 Appears most consistent with tinea.  Treating with Lotrisone and Diflucan.

## 2023-07-24 ENCOUNTER — Ambulatory Visit: Payer: No Typology Code available for payment source | Admitting: Adult Health

## 2023-07-31 ENCOUNTER — Ambulatory Visit: Admitting: Nutrition

## 2023-09-09 ENCOUNTER — Ambulatory Visit: Payer: No Typology Code available for payment source | Admitting: Adult Health

## 2024-01-13 ENCOUNTER — Ambulatory Visit: Payer: Self-pay

## 2024-01-13 ENCOUNTER — Telehealth: Payer: Self-pay | Admitting: Adult Health

## 2024-01-13 DIAGNOSIS — G4733 Obstructive sleep apnea (adult) (pediatric): Secondary | ICD-10-CM

## 2024-01-13 DIAGNOSIS — R140 Abdominal distension (gaseous): Secondary | ICD-10-CM

## 2024-01-13 NOTE — Telephone Encounter (Signed)
 Called pt at [phone] and LVM (Ok per [facility]) asking for call back to discuss further. Reviewed last OV in March which stated he was doing well on bipap. Wondering when these symptoms started and are there any other symptoms.

## 2024-01-13 NOTE — Telephone Encounter (Signed)
 I called the pt back. He said the last 2 months he has noticed some air entrapment in his stomach but it wasn't necessary uncomfortable. Over the last 2 weeks it has become painful and especially last Friday he had severe sharp pain all day. It finally subsided and he has not used his machine since then for fear of pain returning. He would like to use it however. I let him know I would have a provider look over it. If he doesn't hear back from us  before the office closes he is welcome to use it if he likes.

## 2024-01-13 NOTE — Addendum Note (Signed)
 Addended by: Forest Redwine on: 01/13/2024 03:54 PM   Modules accepted: Orders

## 2024-01-13 NOTE — Telephone Encounter (Signed)
 Pt states since using his bi -pap he wakes up with major stomach pains, please call pt to discuss.

## 2024-01-13 NOTE — Telephone Encounter (Signed)
 We can reduce his BiPAP to 18/14 cm for now to see if he tolerates it better.  Order placed.

## 2024-01-13 NOTE — Telephone Encounter (Signed)
 Pt has called Heather, RN back phone rep read my chart message to pt, he'd like to discuss response with RN, please call.

## 2024-01-13 NOTE — Telephone Encounter (Signed)
 I called pt and let him know that Dr. Buck did look at DL and decreased IPAP to 18 and EPAP to 14, changes made remotely.  Sent to DME as well.

## 2024-01-13 NOTE — Telephone Encounter (Signed)
 First attempt: At the request of wife, LVM on both patient's and her voicemail for return call to [phone].  Placed in callbacks for follow up   Copied from CRM #[id number]. Topic: Clinical - Pink Word Triage >> Jan 13, 2024  8:27 AM Ahlexyia S wrote: Pink Word triggered transfer to Nurse Triage. See Triage Message for details. >> Jan 13, 2024  8:30 AM Ahlexyia S wrote: Reason for Triage: Pt wife Augustin called in stating that pt would like to be scheduled due to abdominal pain. Pt is not experiencing any other symptoms at this time. Augustin stated to give pt a call first and if he doesn't answer she can be contacted at [phone].

## 2024-01-13 NOTE — Telephone Encounter (Signed)
 SABRA

## 2024-01-13 NOTE — Telephone Encounter (Signed)
 FYI Only or Action Required?: FYI only for provider.  Patient was last seen in primary care on 07/16/2023 by Cook, Jayce G, DO.  Called Nurse Triage reporting Abdominal Pain.  Symptoms began several days ago.  Interventions attempted: Rest, hydration, or home remedies.  Symptoms are: rapidly improving.  Triage Disposition: See Physician Within 24 Hours  Patient/caregiver understands and will follow disposition?: Yes  Message from Ahlexyia S sent at 01/13/2024  8:30 AM EDT  Reason for Triage: Pt wife Augustin called in stating that pt would like to be scheduled due to abdominal pain. Pt is not experiencing any other symptoms at this time. Augustin stated to give pt a call first and if he doesn't answer she can be contacted at [phone].   Reason for Disposition  [1] MODERATE pain (e.g., interferes with normal activities) AND [2] pain comes and goes (cramps) AND [3] present > 24 hours  (Exception: Pain with Vomiting or Diarrhea - see that Guideline.)  Answer Assessment - Initial Assessment Questions Patient woke up Friday morning with abdominal pain. It progressed throughout the day from upper stomach down to about the belly button. Tender in the center around belly button. Diarrhea through the day. Eased up enough to eat and drink some that night but could feel the digestion and it was not comfortable. Questioned if it was related to CPAP pushing to much air into stomach. Did wake up with belching and gas.  Tender and painful in the morning Saturday- eased off during the day.  Sunday felt better until went for hour long walk with wife. Pain shooting to groin area. Right side tugging to the testicle area. Resolved quickly. Denies feeling like bulge pushing through muscles like a hernia. Thinks he might not be very hydrated.  ED/UC precautions given and understood. Appt 10/28 with PCP  1. LOCATION: Where does it hurt?      Started uoper stomach. And progressed lower to belly button area.   2. RADIATION: Does the pain shoot anywhere else? (e.g., chest, back)    Some back pain yesterday  3. ONSET: When did the pain begin? (Minutes, hours or days ago)      Friday  4. SUDDEN: Gradual or sudden onset?     Present when he woke Friday  and gradually progressed.  5. PATTERN Does the pain come and go, or is it constant?     Comes and goes with intensity 6. SEVERITY: How bad is the pain?  (e.g., Scale 1-10; mild, moderate, or severe)     4/10- 8/10, Sunday felt fine until he went walking and that's when groin pain started and discomfort 5/10 7. RECURRENT SYMPTOM: Have you ever had this type of stomach pain before? If Yes, ask: When was the last time? and What happened that time?      Denies reoccurance.  8. CAUSE: What do you think is causing the stomach pain? (e.g., gallstones, recent abdominal surgery)     Unsure if related to CPAP 9. RELIEVING/AGGRAVATING FACTORS: What makes it better or worse? (e.g., antacids, bending or twisting motion, bowel movement)     Friday nothing relieved but Saturday BM's may have eased up the symptoms 10. OTHER SYMPTOMS: Do you have any other symptoms? (e.g., back pain, diarrhea, fever, urination pain, vomiting)       Diarrhea, belching, nausea  Protocols used: Abdominal Pain - Male-A-AH

## 2024-01-14 ENCOUNTER — Ambulatory Visit: Payer: Self-pay | Admitting: Family Medicine

## 2024-01-14 NOTE — Telephone Encounter (Signed)
 RE: changes made to BIPAP airview Received: Today Zott, Glade Salt, Nena RAMAN, RN; Lavon Milling; Darrel Boyer Thank you for letting us  know, I will note this in his chart     Previous Messages    ----- Message ----- From: Salt Nena RAMAN, RN Sent: 01/13/2024   4:23 PM EDT To: Milling Donnice Boyer Darrel; Stacy Zott Subject: changes made to BIPAP airview                  Good afternoon,  Decreased IPAP to 18 and EPAP to 14, changes made remotely.     Manuel Glover. Male , 38 y.o., 12-17-1985 Pronouns: she/her/hers MRN: [id number] Phone: [phone]  Thanks,  Kaanapali

## 2024-01-14 NOTE — Telephone Encounter (Signed)
 Community message has been sent to [facility] for pressure adjustment on 01/14/24. DD

## 2024-01-15 ENCOUNTER — Encounter: Payer: Self-pay | Admitting: Family Medicine

## 2024-01-15 ENCOUNTER — Ambulatory Visit (INDEPENDENT_AMBULATORY_CARE_PROVIDER_SITE_OTHER): Payer: Self-pay | Admitting: Family Medicine

## 2024-01-15 VITALS — BP 126/78 | HR 95 | Ht 64.0 in | Wt 239.0 lb

## 2024-01-15 DIAGNOSIS — R1010 Upper abdominal pain, unspecified: Secondary | ICD-10-CM | POA: Insufficient documentation

## 2024-01-15 DIAGNOSIS — L409 Psoriasis, unspecified: Secondary | ICD-10-CM

## 2024-01-15 NOTE — Assessment & Plan Note (Signed)
 Currently pain free. Normal abdominal exam. Labs today.

## 2024-01-15 NOTE — Assessment & Plan Note (Signed)
 Referring to dermatology

## 2024-01-15 NOTE — Patient Instructions (Signed)
 Referral placed.  Labs today.  If symptoms worsen or persist, please let me know.

## 2024-01-15 NOTE — Progress Notes (Signed)
 Subjective:  Patient ID: Manuel Glover., male    DOB: [birth year]  Age: 38 y.o. MRN: [id number]  CC:   Chief Complaint  Patient presents with   Abdominal Pain    Started in upper abdomen , now more tender around belly button    HPI:  38 year old male presents for evaluation of the above.  Upper abdominal pain started on Friday. Associated nausea and diarrhea. Migrated to the periumbilical region. Improved over the following days. No fever. No reported change in diet. No reported ingestion of undercooked food. No sick contacts. No pain today.  Additionally, reports return of rash. Previously seen for this and rash was on the inner thighs. Has a history of psoriasis. Rash not in the axilla.   Patient Active Problem List   Diagnosis Date Noted   Upper abdominal pain 01/15/2024   Morbid obesity (HCC) 05/23/2023   Psoriasis 07/24/2022   OSA (obstructive sleep apnea) 12/15/2021   GERD (gastroesophageal reflux disease) 11/11/2012    Social Hx   Social History   Socioeconomic History   Marital status: Married    Spouse name: Not on file   Number of children: Not on file   Years of education: Not on file   Highest education level: 9th grade  Occupational History   Not on file  Tobacco Use   Smoking status: Never   Smokeless tobacco: Never  Vaping Use   Vaping status: Never Used  Substance and Sexual Activity   Alcohol use: Yes    Alcohol/week: 2.0 standard drinks of alcohol    Types: 2 Glasses of wine per week   Drug use: No   Sexual activity: Yes    Partners: Female  Other Topics Concern   Not on file  Social History Narrative   Caffiene 1 serving daily   Lives with wife, and one child   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   Social Drivers of Health   Financial Resource Strain: Low Risk  (07/23/2022)   Overall Financial Resource Strain (CARDIA)    Difficulty of Paying Living Expenses: Not very hard  Food Insecurity: No Food Insecurity (07/23/2022)   Hunger  Vital Sign    Worried About Running Out of Food in the Last Year: Never true    Ran Out of Food in the Last Year: Never true  Transportation Needs: No Transportation Needs (07/23/2022)   PRAPARE - Administrator, Civil Service (Medical): No    Lack of Transportation (Non-Medical): No  Physical Activity: Insufficiently Active (07/23/2022)   Exercise Vital Sign    Days of Exercise per Week: 1 day    Minutes of Exercise per Session: 60 min  Stress: No Stress Concern Present (07/23/2022)   [employer] of Occupational Health - Occupational Stress Questionnaire    Feeling of Stress : Only a little  Social Connections: Moderately Integrated (07/23/2022)   Social Connection and Isolation Panel    Frequency of Communication with Friends and Family: More than three times a week    Frequency of Social Gatherings with Friends and Family: Once a week    Attends Religious Services: 1 to 4 times per year    Active Member of [employer] or Organizations: No    Attends Engineer, Structural: Not on file    Marital Status: Married    Review of Systems Per HPI  Objective:  BP 126/78   Pulse 95   Ht 5' 4 (1.626 m)   Wt 239 lb (  108.4 kg)   SpO2 99%   BMI 41.02 kg/m      01/15/2024    3:54 PM 07/16/2023    4:13 PM 07/01/2023    2:13 PM  BP/Weight  Systolic BP 126 118 109  Diastolic BP 78 79 60  Wt. (Lbs) 239 232.8   BMI 41.02 kg/m2 39.96 kg/m2     Physical Exam Vitals and nursing note reviewed.  Constitutional:      General: He is not in acute distress.    Appearance: Normal appearance. He is obese.  HENT:     Head: Normocephalic and atraumatic.  Cardiovascular:     Rate and Rhythm: Normal rate and regular rhythm.  Pulmonary:     Effort: Pulmonary effort is normal.     Breath sounds: Normal breath sounds.  Abdominal:     General: There is no distension.     Palpations: Abdomen is soft.     Tenderness: There is no abdominal tenderness.  Skin:    Comments: Axilla  with raise erythematous papules and patches.  Neurological:     Mental Status: He is alert.     Lab Results  Component Value Date   WBC 10.1 05/22/2023   HGB 15.4 05/22/2023   HCT 45.3 05/22/2023   PLT 339 05/22/2023   GLUCOSE 85 05/22/2023   CHOL 200 (H) 01/03/2023   TRIG 62 01/03/2023   HDL 55 01/03/2023   LDLCALC 134 (H) 01/03/2023   ALT 20 05/22/2023   AST 19 05/22/2023   NA 140 05/22/2023   K 3.7 05/22/2023   CL 103 05/22/2023   CREATININE 1.08 05/22/2023   BUN 12 05/22/2023   CO2 25 05/22/2023     Assessment & Plan:  Upper abdominal pain Assessment & Plan: Currently pain free. Normal abdominal exam. Labs today.  Orders: -     CBC with Differential/Platelet -     CMP14+EGFR -     Lipase  Psoriasis Assessment & Plan: Referring to dermatology.  Orders: -     Ambulatory referral to Dermatology    Follow-up:  Return if symptoms worsen or fail to improve.  Jacqulyn Ahle DO [facility]

## 2024-01-16 ENCOUNTER — Ambulatory Visit: Payer: Self-pay | Admitting: Family Medicine

## 2024-01-16 LAB — CBC WITH DIFFERENTIAL/PLATELET
Basophils Absolute: 0 x10E3/uL (ref 0.0–0.2)
Basos: 0 %
EOS (ABSOLUTE): 0.2 x10E3/uL (ref 0.0–0.4)
Eos: 2 %
Hematocrit: 46.2 % (ref 37.5–51.0)
Hemoglobin: 15 g/dL (ref 13.0–17.7)
Immature Grans (Abs): 0 x10E3/uL (ref 0.0–0.1)
Immature Granulocytes: 0 %
Lymphocytes Absolute: 3.2 x10E3/uL — ABNORMAL HIGH (ref 0.7–3.1)
Lymphs: 33 %
MCH: 28.2 pg (ref 26.6–33.0)
MCHC: 32.5 g/dL (ref 31.5–35.7)
MCV: 87 fL (ref 79–97)
Monocytes Absolute: 0.6 x10E3/uL (ref 0.1–0.9)
Monocytes: 6 %
Neutrophils Absolute: 5.7 x10E3/uL (ref 1.4–7.0)
Neutrophils: 59 %
Platelets: 334 x10E3/uL (ref 150–450)
RBC: 5.31 x10E6/uL (ref 4.14–5.80)
RDW: 13.5 % (ref 11.6–15.4)
WBC: 9.7 x10E3/uL (ref 3.4–10.8)

## 2024-01-16 LAB — CMP14+EGFR
ALT: 20 IU/L (ref 0–44)
AST: 13 IU/L (ref 0–40)
Albumin: 4.5 g/dL (ref 4.1–5.1)
Alkaline Phosphatase: 66 IU/L (ref 47–123)
BUN/Creatinine Ratio: 13 (ref 9–20)
BUN: 13 mg/dL (ref 6–20)
Bilirubin Total: <0.2 mg/dL (ref 0.0–1.2)
CO2: 23 mmol/L (ref 20–29)
Calcium: 9.6 mg/dL (ref 8.7–10.2)
Chloride: 103 mmol/L (ref 96–106)
Creatinine, Ser: 0.97 mg/dL (ref 0.76–1.27)
Globulin, Total: 2.5 g/dL (ref 1.5–4.5)
Glucose: 81 mg/dL (ref 70–99)
Potassium: 4.2 mmol/L (ref 3.5–5.2)
Sodium: 140 mmol/L (ref 134–144)
Total Protein: 7 g/dL (ref 6.0–8.5)
eGFR: 103 mL/min/1.73 (ref >59)

## 2024-01-16 LAB — LIPASE: Lipase: 18 U/L (ref 13–78)

## 2024-03-09 ENCOUNTER — Telehealth: Payer: Self-pay | Admitting: Adult Health

## 2024-03-09 DIAGNOSIS — G4733 Obstructive sleep apnea (adult) (pediatric): Secondary | ICD-10-CM

## 2024-03-09 NOTE — Telephone Encounter (Signed)
 Request to increase pressure in bipap, please call pt to discuss.

## 2024-03-09 NOTE — Telephone Encounter (Signed)
 Spoke to patient states events per hour are 17.4 and has increased fatigue during the day Pt is asking for pressure increase . Sent request to [facility]

## 2024-03-10 NOTE — Addendum Note (Signed)
 Addended by: SHERRYL DUWAINE SQUIBB on: 03/10/2024 04:05 PM   Modules accepted: Orders

## 2024-03-10 NOTE — Telephone Encounter (Addendum)
 Spoke to patient aware  Per Megan,NP changed settings 19/15  . Pt expressed understanding and thanked me for calling . Did send orders to [facility] this afternoon

## 2024-03-11 NOTE — Telephone Encounter (Addendum)
 Community message has been sent to [facility] for pressure on 03/11/24. DD

## 2024-03-16 NOTE — Telephone Encounter (Signed)
 Manuel Glover

## 2024-03-24 ENCOUNTER — Ambulatory Visit (INDEPENDENT_AMBULATORY_CARE_PROVIDER_SITE_OTHER)

## 2024-03-24 VITALS — BP 121/73 | Temp 98.1°F | Ht 64.0 in | Wt 237.2 lb

## 2024-03-24 DIAGNOSIS — S61253A Open bite of left middle finger without damage to nail, initial encounter: Secondary | ICD-10-CM | POA: Diagnosis not present

## 2024-03-24 MED ORDER — METRONIDAZOLE 500 MG PO TABS: 500.0000 mg | ORAL_TABLET | Freq: Two times a day (BID) | ORAL | 0 refills | Status: AC

## 2024-03-24 MED ORDER — SULFAMETHOXAZOLE-TRIMETHOPRIM 800-160 MG PO TABS: 1.0000 | ORAL_TABLET | Freq: Two times a day (BID) | ORAL | 0 refills | Status: AC

## 2024-03-24 NOTE — Progress Notes (Signed)
 "  Acute Office Visit  Subjective:     Patient ID: Manuel Harts., male    DOB: [birth year], 39 y.o.   MRN: [id number]  Chief Complaint  Patient presents with   Animal Bite    Left middle finger Friday Jan. 2nd  Swollen and cannot fully bend finger Deep bite, bled a lot    Animal Bite  Pertinent negatives include no chest pain and no cough.   Patient is in today for a dog bite that occurred on Friday. The dog was his pet, and is up to date on all vaccinations. Has been keeping a band-aid on the wound. No fever, chills, redness, or warmth to the wound. Denies any drainage from the wound.   Review of Systems  Constitutional:  Negative for chills and fever.  Respiratory:  Negative for cough, chest tightness, shortness of breath and wheezing.   Cardiovascular:  Negative for chest pain and palpitations.  Skin:  Positive for wound.      Objective:    Today's Vitals   03/24/24 1612  BP: 121/73  Temp: 98.1 F (36.7 C)  SpO2: 100%  Weight: 237 lb 3.2 oz (107.6 kg)  Height: 5' 4 (1.626 m)   Body mass index is 40.72 kg/m.   Physical Exam Vitals and nursing note reviewed.  Constitutional:      General: He is not in acute distress.    Appearance: Normal appearance. He is not ill-appearing.  Cardiovascular:     Rate and Rhythm: Normal rate and regular rhythm.     Heart sounds: Normal heart sounds, S1 normal and S2 normal. No murmur heard. Pulmonary:     Effort: Pulmonary effort is normal. No respiratory distress.     Breath sounds: Normal breath sounds. No wheezing.  Skin:    Comments: Left middle finger: 1 cm superficial linear dog bite wound located at the distal phalanx of the left middle finger. Wound edges are well-approximated. No active bleeding, bruising, swelling or visible deformity noted. No signs of deep tissue involvement. Patient unable to fully flex hand secondary to pain. Sensation and perfusion appear intact.   Neurological:     Mental Status: He is  alert.  Psychiatric:        Mood and Affect: Mood normal.        Behavior: Behavior normal.        Thought Content: Thought content normal.        Judgment: Judgment normal.    No results found for any visits on 03/24/24.    Assessment & Plan:  1. Open wound of left middle finger due to animal bite (Primary) -Advised patient to cleanse wound with soap and water . Keep wound clean and dry; clean dressing daily. -Will start prophylactic antibiotics due to bite location. Instructed patient to ensure he finishes antibiotics. Educated patient on appropriate use and potential side effects.   -Tetanus vaccine unavailable in office. Advised patient to get this at local pharmacy.  -Recommend acetaminophen  or ibuprofen  as needed for pain.  -Encouraged ROM exercises as tolerated.  -Gave warning signs of infection, and advised patient to return if these symptoms occur or if he experiences worsening pain or mobility. Patient agrees with plan.  - metroNIDAZOLE  (FLAGYL ) 500 MG tablet; Take 1 tablet (500 mg total) by mouth 2 (two) times daily with a meal.  Dispense: 10 tablet; Refill: 0 - sulfamethoxazole -trimethoprim  (BACTRIM  DS) 800-160 MG tablet; Take 1 tablet by mouth 2 (two) times daily.  Dispense: 10 tablet; Refill:  0   Return if symptoms worsen or fail to improve.   Damien KATHEE Pringle, FNP  "

## 2024-06-01 ENCOUNTER — Ambulatory Visit: Admitting: Adult Health

## 2024-08-26 ENCOUNTER — Ambulatory Visit: Admitting: Physician Assistant
